# Patient Record
Sex: Female | Born: 1985 | Race: Black or African American | Hispanic: No | Marital: Single | Smoking: Current every day smoker
Health system: Southern US, Community
[De-identification: ages and names within clinical notes are randomized; demographics above are authoritative.]

## PROBLEM LIST (undated history)

## (undated) ENCOUNTER — Ambulatory Visit

## (undated) ENCOUNTER — Encounter

## (undated) ENCOUNTER — Ambulatory Visit
Payer: BLUE CROSS/BLUE SHIELD | Attending: Student in an Organized Health Care Education/Training Program | Primary: Student in an Organized Health Care Education/Training Program

## (undated) ENCOUNTER — Telehealth

## (undated) ENCOUNTER — Encounter: Attending: Internal Medicine | Primary: Internal Medicine

## (undated) ENCOUNTER — Encounter: Attending: Dermatology | Primary: Dermatology

## (undated) ENCOUNTER — Ambulatory Visit: Payer: PRIVATE HEALTH INSURANCE | Attending: Internal Medicine | Primary: Internal Medicine

## (undated) ENCOUNTER — Encounter
Attending: Student in an Organized Health Care Education/Training Program | Primary: Student in an Organized Health Care Education/Training Program

## (undated) ENCOUNTER — Ambulatory Visit: Payer: BLUE CROSS/BLUE SHIELD

## (undated) ENCOUNTER — Encounter: Payer: PRIVATE HEALTH INSURANCE | Attending: Dermatology | Primary: Dermatology

## (undated) ENCOUNTER — Ambulatory Visit
Payer: PRIVATE HEALTH INSURANCE | Attending: Student in an Organized Health Care Education/Training Program | Primary: Student in an Organized Health Care Education/Training Program

## (undated) ENCOUNTER — Telehealth
Attending: Student in an Organized Health Care Education/Training Program | Primary: Student in an Organized Health Care Education/Training Program

## (undated) ENCOUNTER — Ambulatory Visit: Payer: PRIVATE HEALTH INSURANCE | Attending: Dermatology | Primary: Dermatology

## (undated) DIAGNOSIS — Z8619 Personal history of other infectious and parasitic diseases: Secondary | ICD-10-CM

## (undated) DIAGNOSIS — Z8742 Personal history of other diseases of the female genital tract: Secondary | ICD-10-CM

## (undated) DIAGNOSIS — Z87440 Personal history of urinary (tract) infections: Secondary | ICD-10-CM

## (undated) DIAGNOSIS — L732 Hidradenitis suppurativa: Secondary | ICD-10-CM

## (undated) HISTORY — DX: Personal history of other infectious and parasitic diseases: Z86.19

## (undated) HISTORY — PX: PILONIDAL CYST EXCISION: SHX744

## (undated) HISTORY — DX: Hidradenitis suppurativa: L73.2

## (undated) HISTORY — DX: Personal history of other diseases of the female genital tract: Z87.42

## (undated) HISTORY — DX: Personal history of urinary (tract) infections: Z87.440

## (undated) SURGERY — Surgical Case
Anesthesia: *Unknown

---

## 2005-02-01 ENCOUNTER — Ambulatory Visit: Payer: Self-pay | Admitting: Family Medicine

## 2005-03-12 ENCOUNTER — Ambulatory Visit: Payer: Self-pay | Admitting: Family Medicine

## 2005-04-19 ENCOUNTER — Ambulatory Visit: Payer: Self-pay | Admitting: Family Medicine

## 2005-04-19 ENCOUNTER — Other Ambulatory Visit: Admission: RE | Admit: 2005-04-19 | Discharge: 2005-04-19 | Payer: Self-pay | Admitting: Family Medicine

## 2005-04-26 ENCOUNTER — Ambulatory Visit: Payer: Self-pay | Admitting: Family Medicine

## 2005-05-28 ENCOUNTER — Inpatient Hospital Stay (HOSPITAL_COMMUNITY): Admission: AD | Admit: 2005-05-28 | Discharge: 2005-05-28 | Payer: Self-pay | Admitting: *Deleted

## 2005-05-31 ENCOUNTER — Ambulatory Visit: Payer: Self-pay | Admitting: Family Medicine

## 2005-06-07 ENCOUNTER — Ambulatory Visit: Payer: Self-pay | Admitting: Family Medicine

## 2005-07-01 ENCOUNTER — Ambulatory Visit: Payer: Self-pay | Admitting: Family Medicine

## 2005-08-22 ENCOUNTER — Ambulatory Visit: Payer: Self-pay | Admitting: Family Medicine

## 2005-10-07 ENCOUNTER — Inpatient Hospital Stay (HOSPITAL_COMMUNITY): Admission: AD | Admit: 2005-10-07 | Discharge: 2005-10-07 | Payer: Self-pay | Admitting: Family Medicine

## 2005-11-13 ENCOUNTER — Ambulatory Visit: Payer: Self-pay | Admitting: Family Medicine

## 2006-02-04 ENCOUNTER — Ambulatory Visit: Payer: Self-pay | Admitting: Family Medicine

## 2006-12-23 DIAGNOSIS — Z8742 Personal history of other diseases of the female genital tract: Secondary | ICD-10-CM

## 2006-12-23 HISTORY — PX: COLPOSCOPY: SHX161

## 2006-12-23 HISTORY — DX: Personal history of other diseases of the female genital tract: Z87.42

## 2007-02-08 ENCOUNTER — Emergency Department: Payer: Self-pay | Admitting: Emergency Medicine

## 2007-04-02 ENCOUNTER — Emergency Department: Payer: Self-pay | Admitting: General Practice

## 2007-04-14 ENCOUNTER — Emergency Department: Payer: Self-pay | Admitting: Emergency Medicine

## 2007-10-07 ENCOUNTER — Emergency Department: Payer: Self-pay | Admitting: Emergency Medicine

## 2007-10-25 ENCOUNTER — Emergency Department: Payer: Self-pay | Admitting: Emergency Medicine

## 2008-05-05 ENCOUNTER — Observation Stay: Payer: Self-pay

## 2008-05-09 ENCOUNTER — Inpatient Hospital Stay: Payer: Self-pay

## 2008-05-10 DIAGNOSIS — O480 Post-term pregnancy: Secondary | ICD-10-CM

## 2008-09-01 IMAGING — US US OB < 14 WEEKS - US OB TV
1 series · 17 of 28 positions shown · non-contrast
Comparison: none

REASON FOR EXAM: Pregnant, spotting
COMMENTS:

[Series 1: us ob < 14 weeks - us ob tv · 17 of 84 slices shown]
[im 1/84]
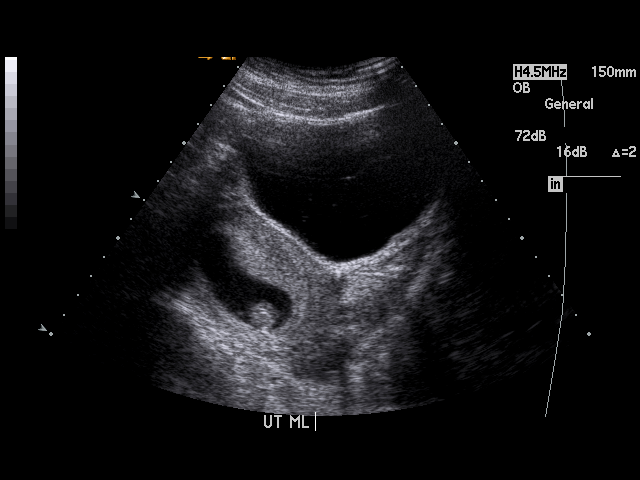
[im 7/84]
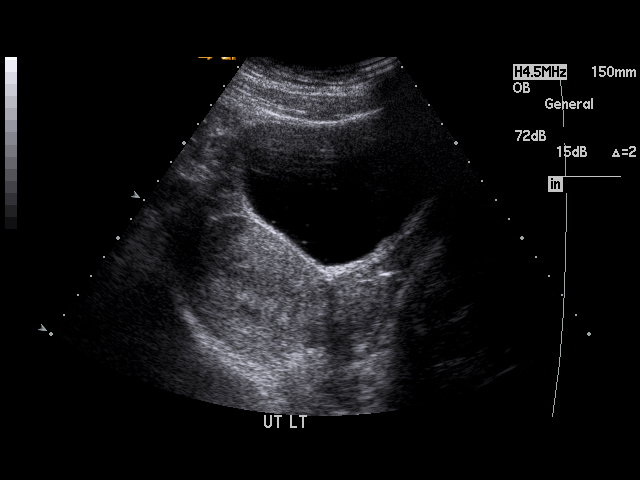
[im 13/84]
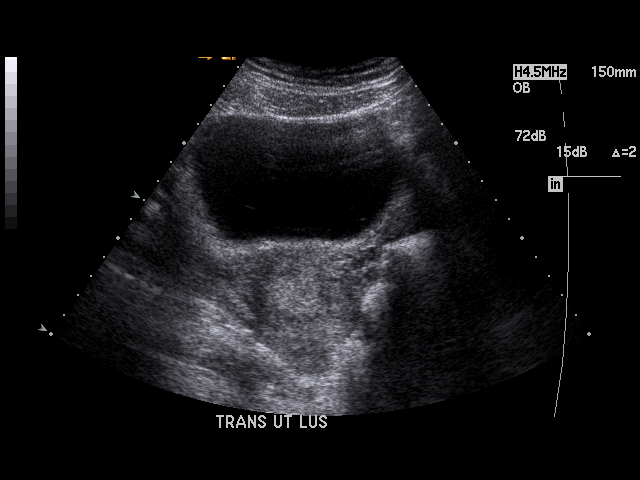
[im 16/84]
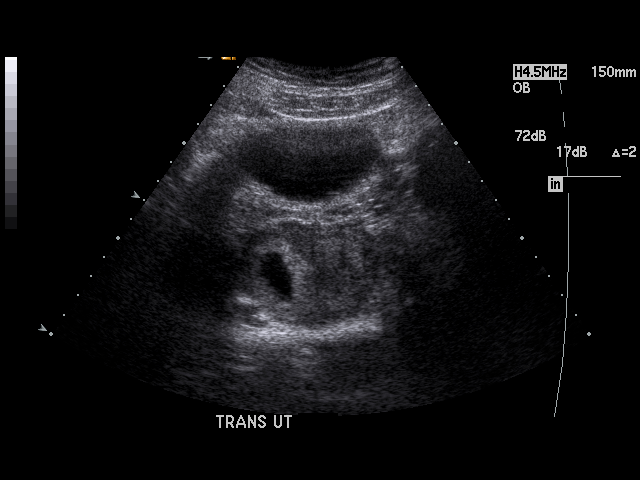
[im 22/84]
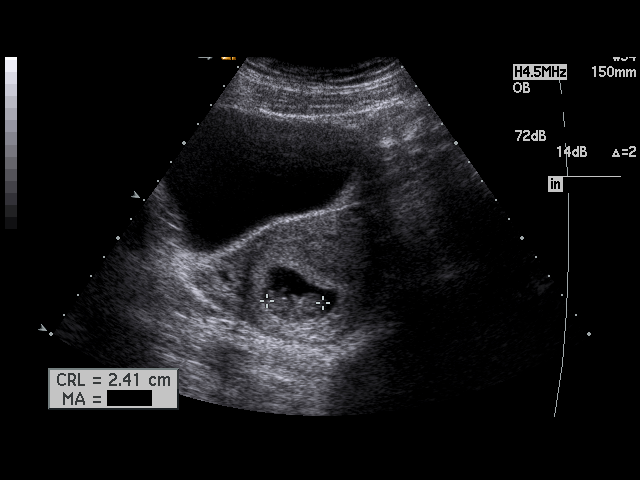
[im 28/84]
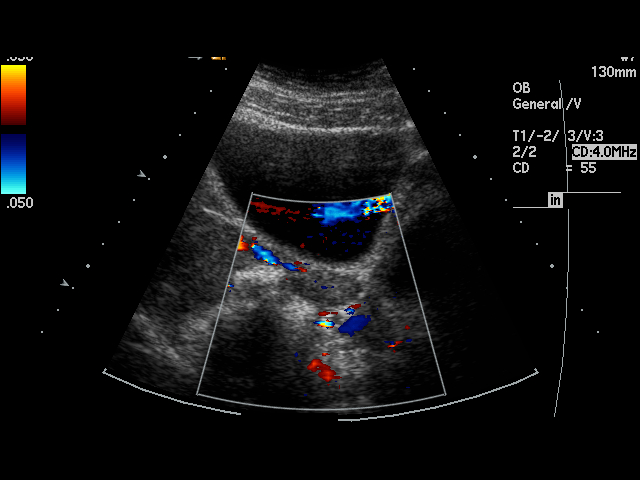
[im 31/84]
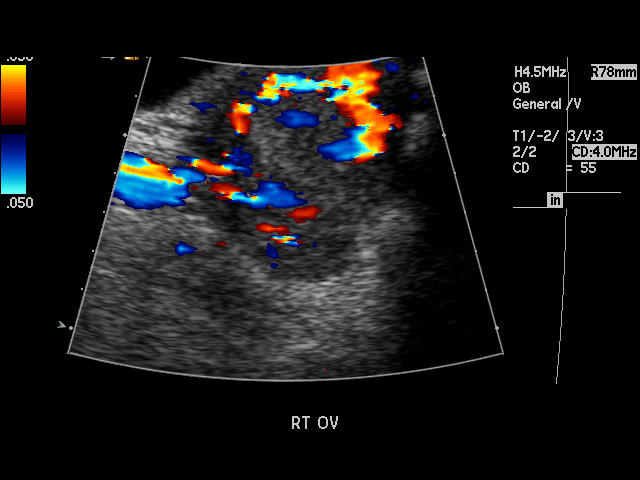
[im 37/84]
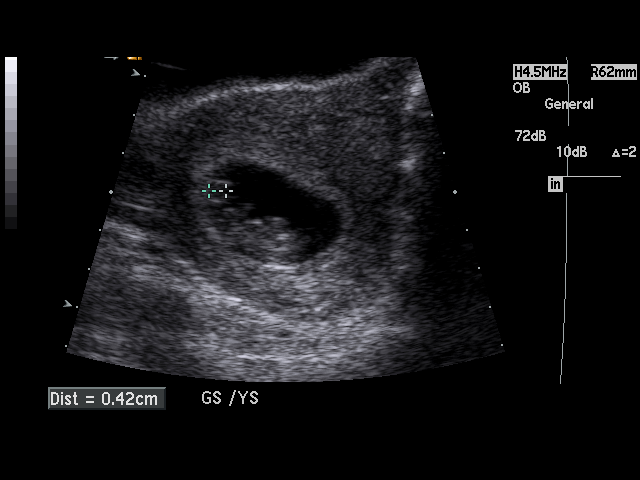
[im 44/84]
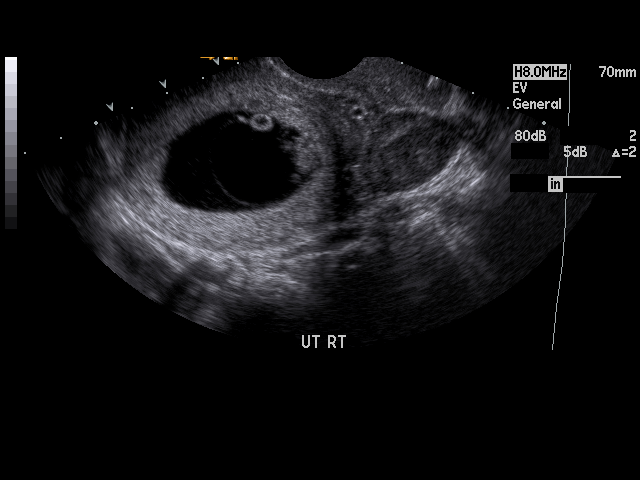
[im 47/84]
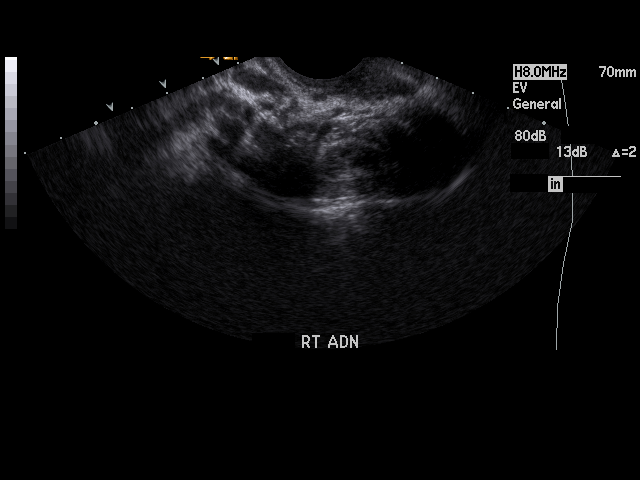
[im 53/84]
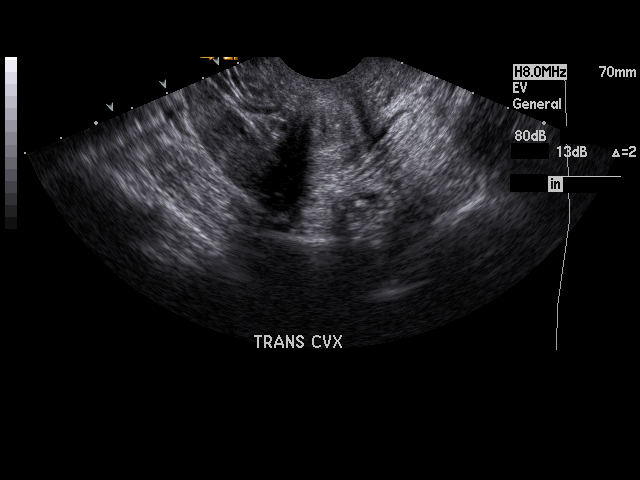
[im 56/84]
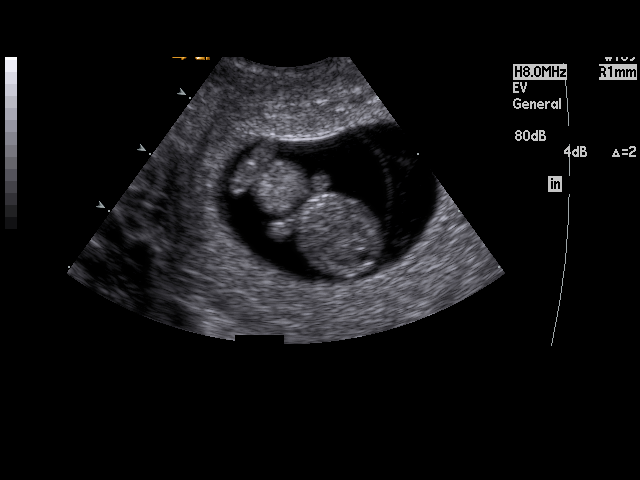
[im 62/84]
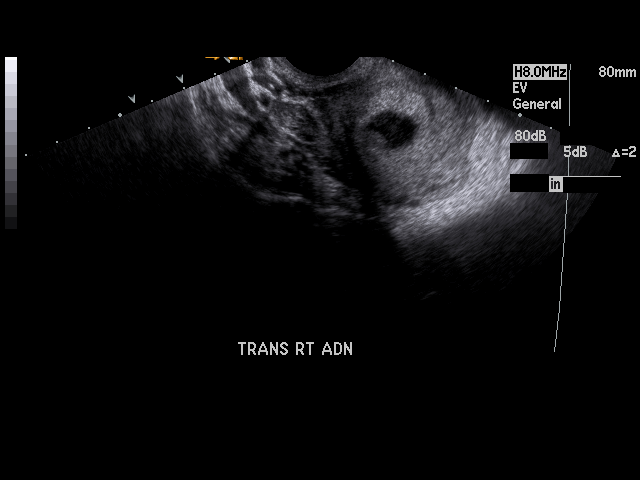
[im 68/84]
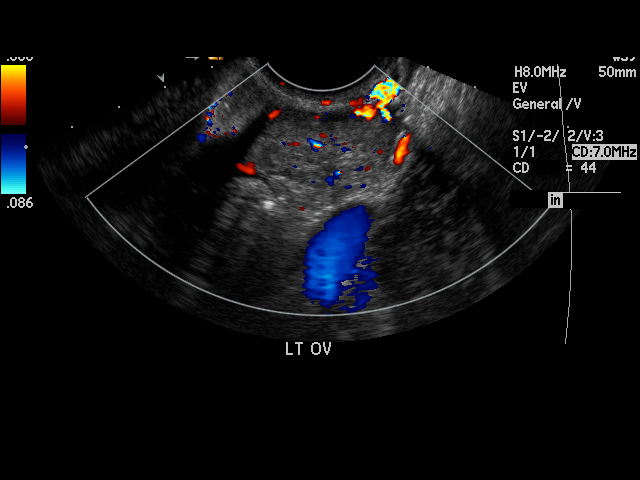
[im 71/84]
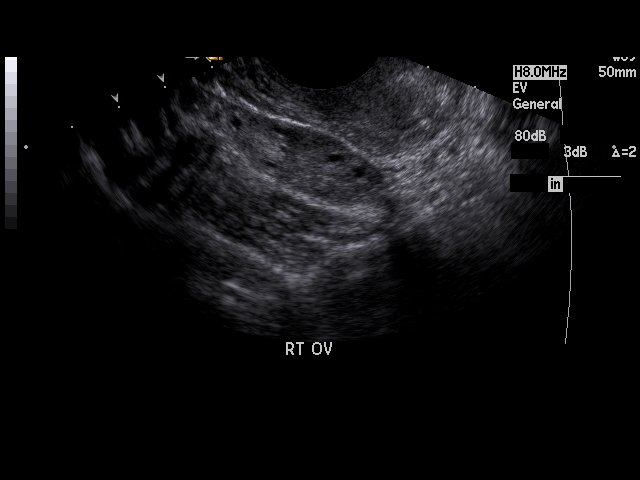
[im 77/84]
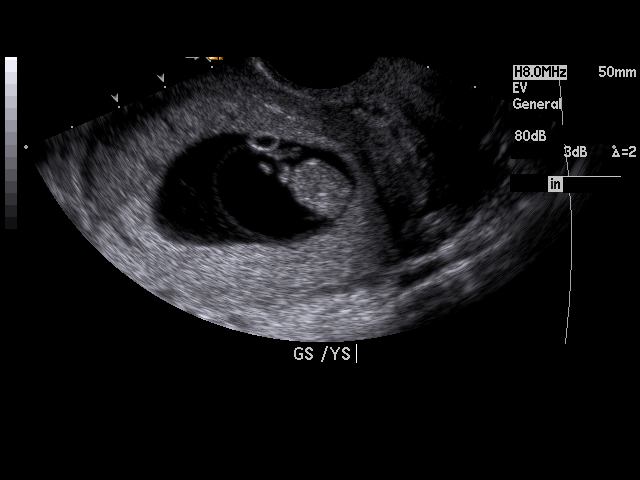
[im 84/84]
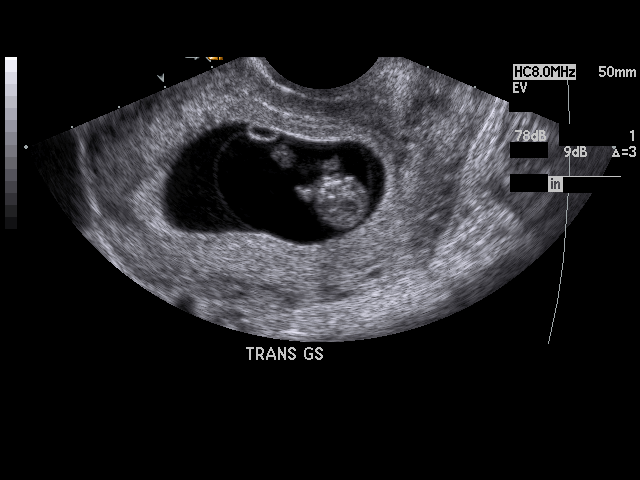

[17 of 28 positions shown; findings below may reference images not displayed]

PROCEDURE:     US  - US OB LESS THAN 14 WEEKS  - October 07, 2007  [DATE]

RESULT:     There is a viable IUP present. The crown-rump length measures an
average of 2.35 cm, corresponding to a 9 weeks, 0 day gestation. Cardiac
activity with a rate of 171 beats per minute was seen. There is no evidence
of a subchorionic hemorrhage. No free fluid is seen in the cul-de-sac. The
RIGHT ovary measures 3.5 x 2.4 x 1.4 cm. The LEFT ovary measures 2.9 x 2.3 x
1.2 cm.
IMPRESSION: There is a viable IUP with an estimated gestational age of
9 weeks, 0 days. The estimated date of confinement is 05/11/2008.

This report was called to the [HOSPITAL] the conclusion of the
study.

## 2009-05-24 ENCOUNTER — Ambulatory Visit: Payer: Self-pay

## 2009-05-24 LAB — HM PAP SMEAR: HM PAP: NEGATIVE

## 2009-11-08 ENCOUNTER — Observation Stay: Payer: Self-pay

## 2009-11-09 ENCOUNTER — Inpatient Hospital Stay: Payer: Self-pay

## 2010-12-23 HISTORY — PX: BREAST SURGERY: SHX581

## 2011-04-26 ENCOUNTER — Emergency Department: Payer: Self-pay | Admitting: Emergency Medicine

## 2011-06-04 ENCOUNTER — Emergency Department: Payer: Self-pay | Admitting: Emergency Medicine

## 2011-08-16 ENCOUNTER — Emergency Department: Payer: Self-pay | Admitting: Emergency Medicine

## 2011-09-02 ENCOUNTER — Emergency Department: Payer: Self-pay | Admitting: Emergency Medicine

## 2011-10-15 ENCOUNTER — Emergency Department: Payer: Self-pay | Admitting: Emergency Medicine

## 2012-02-25 ENCOUNTER — Ambulatory Visit: Payer: Self-pay | Admitting: Emergency Medicine

## 2012-02-25 LAB — RAPID URINE DRUG SCREEN, HOSP PERFORMED
Barbiturates, Ur Screen: NEGATIVE (ref ?–200)
Benzodiazepine, Ur Scrn: NEGATIVE (ref ?–200)
Cannabinoid 50 Ng, Ur ~~LOC~~: POSITIVE (ref ?–50)
Opiate, Ur Screen: NEGATIVE (ref ?–300)

## 2012-02-25 LAB — PREGNANCY, URINE: Pregnancy Test, Urine: NEGATIVE m[IU]/mL

## 2012-02-28 LAB — PATHOLOGY REPORT

## 2013-03-06 ENCOUNTER — Emergency Department: Payer: Self-pay | Admitting: Emergency Medicine

## 2013-07-22 ENCOUNTER — Emergency Department: Payer: Self-pay | Admitting: Emergency Medicine

## 2013-07-22 LAB — URINALYSIS, COMPLETE
Bilirubin,UR: NEGATIVE
Blood: NEGATIVE
Glucose,UR: NEGATIVE mg/dL (ref 0–75)
Nitrite: POSITIVE
Ph: 5 (ref 4.5–8.0)
Protein: NEGATIVE

## 2013-07-22 LAB — GC/CHLAMYDIA PROBE AMP

## 2013-07-22 LAB — WET PREP, GENITAL

## 2015-04-16 NOTE — Op Note (Signed)
PATIENT NAME:  Jamie Dougherty, [name] MR#:  [id number] DATE OF BIRTH:  [birth year]  DATE OF PROCEDURE:  02/25/2012  PREOPERATIVE DIAGNOSIS:  1. Hydradenitis of the left breast.  2. Hydradenitis of the left axilla which looks very complicated. The patient has had multiple openings in the left axilla due to infection.   POSTOPERATIVE DIAGNOSIS:  1. Hydradenitis of the left breast.  2. Hydradenitis of the left axilla which looks very complicated. The patient has had multiple openings in the left axilla due to infection.   PROCEDURE PERFORMED:  Excision of hydradenitis of the left breast and hydradenitis of the left axilla.   SURGEON: Jovita GammaMasud Jalesia Loudenslager, MD    DESCRIPTION OF PROCEDURE: First of all, this patient was seen by me in the office and has a long history of draining from this hydradenitis of the left breast. The left breast was then prepped and draped. An elliptical incision was made at about 6:00 position. After cutting skin and subcutaneous tissue, the infected area was then completely excised. The wound was then closed in layers with 4-0 Vicryl and with skin staples.   The left axilla was then seen. The patient had multiple areas of draining sinuses. The elliptical incision was made, and quite a bit of skin and subcutaneous tissue was then completely excised; and, after that, I put a Penrose drain deep in the axillary space to bring it out. The wound was then closed in layers. The skin was closed with 4-0 nylon. The patient  tolerated the procedure well and was sent to the recovery room in satisfactory condition.  ____________________________ Alton RevereMasud S. Cecelia ByarsHashmi, MD msh:cbb D: 02/25/2012 14:55:57 ET T: 02/25/2012 17:05:23 ET JOB#: [id number]  cc: Khalea Ventura S. Cecelia ByarsHashmi, MD, <Dictator> Meryle ReadyMASUD S Dublin Grayer MD ELECTRONICALLY SIGNED 03/04/2012 13:43

## 2016-08-20 LAB — OB RESULTS CONSOLE RPR: RPR: NONREACTIVE

## 2016-08-20 LAB — OB RESULTS CONSOLE HGB/HCT, BLOOD
HCT: 33 %
Hemoglobin: 10.9 g/dL

## 2016-08-20 LAB — OB RESULTS CONSOLE HEPATITIS B SURFACE ANTIGEN: HEP B S AG: NEGATIVE

## 2016-08-20 LAB — OB RESULTS CONSOLE GC/CHLAMYDIA
Chlamydia: NEGATIVE
GC PROBE AMP, GENITAL: NEGATIVE

## 2016-08-20 LAB — OB RESULTS CONSOLE TSH: TSH: 0.541

## 2016-08-20 LAB — OB RESULTS CONSOLE ABO/RH: RH Type: POSITIVE

## 2016-08-20 LAB — SICKLE CELL SCREEN: SICKLE CELL SCREEN: NORMAL

## 2016-08-20 LAB — OB RESULTS CONSOLE PLATELET COUNT: PLATELETS: 230 10*3/uL

## 2016-08-20 LAB — OB RESULTS CONSOLE RUBELLA ANTIBODY, IGM: RUBELLA: IMMUNE

## 2016-08-20 LAB — OB RESULTS CONSOLE HIV ANTIBODY (ROUTINE TESTING): HIV: NONREACTIVE

## 2016-08-20 LAB — OB RESULTS CONSOLE VARICELLA ZOSTER ANTIBODY, IGG: VARICELLA IGG: IMMUNE

## 2016-08-20 LAB — OB RESULTS CONSOLE ANTIBODY SCREEN: ANTIBODY SCREEN: NEGATIVE

## 2016-12-23 NOTE — L&D Delivery Note (Signed)
Delivery Note At 1:45 AM a viable female was delivered via Vaginal, Spontaneous Delivery (Presentation: OA).  APGAR: 8, 9; weight pending.   Placenta status: delivered spontaneously, intact.  Cord: 3VC with the following complications: none.  Cord pH: n/a  Anesthesia: epidural  Episiotomy: None Lacerations: None Suture Repair: n/a/ Est. Blood Loss (mL): 350  Mom to postpartum.  Baby to Couplet care / Skin to Skin.  Called to see patient.  Mom pushed to deliver a viable female infant.  The head followed by shoulders, which delivered without difficulty, and the rest of the body.  No nuchal cord noted.  Baby to mom's chest.  Cord clamped and cut after > 1 min delay.  No cord blood obtained.  Placenta delivered spontaneously, intact, with a 3-vessel cord.  No vaginal, cervical, or perineal lacerations. All counts correct.  Hemostasis obtained with IV pitocin and fundal massage. EBL 350 mL.     Thomasene Mohair, MD 03/24/2017, 1:58 AM

## 2017-03-06 ENCOUNTER — Ambulatory Visit (INDEPENDENT_AMBULATORY_CARE_PROVIDER_SITE_OTHER): Payer: 59 | Admitting: Advanced Practice Midwife

## 2017-03-06 ENCOUNTER — Encounter: Payer: Self-pay | Admitting: Advanced Practice Midwife

## 2017-03-06 VITALS — BP 108/64 | Ht 65.0 in | Wt 225.0 lb

## 2017-03-06 DIAGNOSIS — Z113 Encounter for screening for infections with a predominantly sexual mode of transmission: Secondary | ICD-10-CM

## 2017-03-06 DIAGNOSIS — Z3480 Encounter for supervision of other normal pregnancy, unspecified trimester: Secondary | ICD-10-CM

## 2017-03-06 DIAGNOSIS — Z3685 Encounter for antenatal screening for Streptococcus B: Secondary | ICD-10-CM

## 2017-03-06 DIAGNOSIS — Z3A36 36 weeks gestation of pregnancy: Secondary | ICD-10-CM

## 2017-03-06 NOTE — Progress Notes (Signed)
CO's of pressure, contractions, swelling, and headaches

## 2017-03-06 NOTE — Patient Instructions (Signed)

## 2017-03-06 NOTE — Progress Notes (Signed)
GBS/aptima today. Cervix is visually closed. Work restriction note given.

## 2017-03-08 LAB — GC/CHLAMYDIA PROBE AMP
CHLAMYDIA, DNA PROBE: NEGATIVE
Neisseria gonorrhoeae by PCR: NEGATIVE

## 2017-03-08 LAB — STREP GP B NAA: Strep Gp B NAA: POSITIVE — AB

## 2017-03-13 ENCOUNTER — Ambulatory Visit (INDEPENDENT_AMBULATORY_CARE_PROVIDER_SITE_OTHER): Payer: 59 | Admitting: Advanced Practice Midwife

## 2017-03-13 VITALS — BP 120/68 | Wt 224.0 lb

## 2017-03-13 DIAGNOSIS — Z3A37 37 weeks gestation of pregnancy: Secondary | ICD-10-CM

## 2017-03-13 NOTE — Patient Instructions (Signed)

## 2017-03-13 NOTE — Progress Notes (Signed)
ctx

## 2017-03-13 NOTE — Progress Notes (Signed)
Feeling tired. Reviewed labor precautions. Work note for last day 4/4 given to pt today.

## 2017-03-19 ENCOUNTER — Ambulatory Visit (INDEPENDENT_AMBULATORY_CARE_PROVIDER_SITE_OTHER): Payer: 59 | Admitting: Advanced Practice Midwife

## 2017-03-19 VITALS — BP 118/74 | Wt 226.0 lb

## 2017-03-19 DIAGNOSIS — Z3A38 38 weeks gestation of pregnancy: Secondary | ICD-10-CM

## 2017-03-19 NOTE — Progress Notes (Signed)
Pt has decided to go out of work beginning today. Work note given. Reviewed labor precautions. Having a few painful contractions, mostly BH ctx.

## 2017-03-23 ENCOUNTER — Inpatient Hospital Stay
Admission: EM | Admit: 2017-03-23 | Discharge: 2017-03-26 | DRG: 775 | Disposition: A | Discharge status: Home / Self Care | Payer: Commercial Managed Care - HMO | Attending: Obstetrics and Gynecology | Admitting: Obstetrics and Gynecology

## 2017-03-23 ENCOUNTER — Inpatient Hospital Stay: Payer: Commercial Managed Care - HMO | Admitting: Anesthesiology

## 2017-03-23 DIAGNOSIS — D649 Anemia, unspecified: Secondary | ICD-10-CM | POA: Diagnosis not present

## 2017-03-23 DIAGNOSIS — O99334 Smoking (tobacco) complicating childbirth: Secondary | ICD-10-CM | POA: Diagnosis present

## 2017-03-23 DIAGNOSIS — O99214 Obesity complicating childbirth: Secondary | ICD-10-CM | POA: Diagnosis present

## 2017-03-23 DIAGNOSIS — Z3A39 39 weeks gestation of pregnancy: Secondary | ICD-10-CM | POA: Diagnosis not present

## 2017-03-23 DIAGNOSIS — O480 Post-term pregnancy: Secondary | ICD-10-CM | POA: Diagnosis present

## 2017-03-23 DIAGNOSIS — F17210 Nicotine dependence, cigarettes, uncomplicated: Secondary | ICD-10-CM | POA: Diagnosis present

## 2017-03-23 DIAGNOSIS — O99824 Streptococcus B carrier state complicating childbirth: Secondary | ICD-10-CM | POA: Diagnosis present

## 2017-03-23 DIAGNOSIS — O99213 Obesity complicating pregnancy, third trimester: Secondary | ICD-10-CM | POA: Diagnosis present

## 2017-03-23 DIAGNOSIS — O9081 Anemia of the puerperium: Secondary | ICD-10-CM | POA: Diagnosis not present

## 2017-03-23 DIAGNOSIS — O0993 Supervision of high risk pregnancy, unspecified, third trimester: Secondary | ICD-10-CM

## 2017-03-23 DIAGNOSIS — O4292 Full-term premature rupture of membranes, unspecified as to length of time between rupture and onset of labor: Secondary | ICD-10-CM | POA: Diagnosis present

## 2017-03-23 DIAGNOSIS — Z6833 Body mass index (BMI) 33.0-33.9, adult: Secondary | ICD-10-CM

## 2017-03-23 DIAGNOSIS — Z8249 Family history of ischemic heart disease and other diseases of the circulatory system: Secondary | ICD-10-CM

## 2017-03-23 DIAGNOSIS — E669 Obesity, unspecified: Secondary | ICD-10-CM | POA: Diagnosis present

## 2017-03-23 LAB — CBC
HEMATOCRIT: 33.9 % — AB (ref 35.0–47.0)
Hemoglobin: 11.3 g/dL — ABNORMAL LOW (ref 12.0–16.0)
MCH: 26.7 pg (ref 26.0–34.0)
MCHC: 33.4 g/dL (ref 32.0–36.0)
MCV: 79.9 fL — ABNORMAL LOW (ref 80.0–100.0)
PLATELETS: 220 10*3/uL (ref 150–440)
RBC: 4.25 MIL/uL (ref 3.80–5.20)
RDW: 17 % — AB (ref 11.5–14.5)
WBC: 7.1 10*3/uL (ref 3.6–11.0)

## 2017-03-23 LAB — TYPE AND SCREEN
ABO/RH(D): O POS
ANTIBODY SCREEN: NEGATIVE

## 2017-03-23 LAB — URINE DRUG SCREEN, QUALITATIVE (ARMC ONLY)
AMPHETAMINES, UR SCREEN: NOT DETECTED
BARBITURATES, UR SCREEN: NOT DETECTED
BENZODIAZEPINE, UR SCRN: NOT DETECTED
CANNABINOID 50 NG, UR ~~LOC~~: NOT DETECTED
Cocaine Metabolite,Ur ~~LOC~~: NOT DETECTED
MDMA (Ecstasy)Ur Screen: NOT DETECTED
Methadone Scn, Ur: NOT DETECTED
Opiate, Ur Screen: NOT DETECTED
PHENCYCLIDINE (PCP) UR S: NOT DETECTED
Tricyclic, Ur Screen: NOT DETECTED

## 2017-03-23 LAB — GLUCOSE, CAPILLARY: GLUCOSE-CAPILLARY: 127 mg/dL — AB (ref 65–99)

## 2017-03-23 MED ORDER — PHENYLEPHRINE 40 MCG/ML (10ML) SYRINGE FOR IV PUSH (FOR BLOOD PRESSURE SUPPORT)
80.0000 ug | PREFILLED_SYRINGE | INTRAVENOUS | Status: DC | PRN
  Filled 2017-03-23: qty 5

## 2017-03-23 MED ORDER — PENICILLIN G POTASSIUM 5000000 UNITS IJ SOLR
5.0000 10*6.[IU] | Freq: Once | INTRAVENOUS | Status: DC
  Filled 2017-03-23: qty 5

## 2017-03-23 MED ORDER — MISOPROSTOL 200 MCG PO TABS
ORAL_TABLET | ORAL | Status: AC
  Filled 2017-03-23: qty 4

## 2017-03-23 MED ORDER — LIDOCAINE HCL (PF) 1 % IJ SOLN: 30.0000 mL | INTRAMUSCULAR | Status: DC | PRN

## 2017-03-23 MED ORDER — SODIUM CHLORIDE FLUSH 0.9 % IV SOLN
INTRAVENOUS | Status: AC
  Filled 2017-03-23: qty 10

## 2017-03-23 MED ORDER — SODIUM CHLORIDE 0.9 % IV SOLN
2.0000 g | Freq: Once | INTRAVENOUS | Status: AC
  Administered 2017-03-23: 2 g via INTRAVENOUS
  Filled 2017-03-23: qty 2000

## 2017-03-23 MED ORDER — SODIUM CHLORIDE 0.9 % IV SOLN
1.0000 g | INTRAVENOUS | Status: DC
  Administered 2017-03-23 – 2017-03-24 (×3): 1 g via INTRAVENOUS
  Filled 2017-03-23 (×5): qty 1000

## 2017-03-23 MED ORDER — SOD CITRATE-CITRIC ACID 500-334 MG/5ML PO SOLN
30.0000 mL | ORAL | Status: DC | PRN
  Administered 2017-03-23: 30 mL via ORAL
  Filled 2017-03-23: qty 15
  Filled 2017-03-23: qty 30

## 2017-03-23 MED ORDER — PENICILLIN G POT IN DEXTROSE 60000 UNIT/ML IV SOLN
3.0000 10*6.[IU] | INTRAVENOUS | Status: DC
  Filled 2017-03-23 (×3): qty 50

## 2017-03-23 MED ORDER — EPHEDRINE 5 MG/ML INJ
10.0000 mg | INTRAVENOUS | Status: DC | PRN
  Filled 2017-03-23: qty 2

## 2017-03-23 MED ORDER — FENTANYL 2.5 MCG/ML W/ROPIVACAINE 0.2% IN NS 100 ML EPIDURAL INFUSION (ARMC-ANES)
EPIDURAL | Status: DC | PRN
  Administered 2017-03-23: 10 mL/h via EPIDURAL

## 2017-03-23 MED ORDER — LIDOCAINE-EPINEPHRINE (PF) 1.5 %-1:200000 IJ SOLN
INTRAMUSCULAR | Status: DC | PRN
  Administered 2017-03-23: 4 mL via EPIDURAL

## 2017-03-23 MED ORDER — LACTATED RINGERS IV SOLN
INTRAVENOUS | Status: DC
  Administered 2017-03-23: 13:00:00 via INTRAVENOUS

## 2017-03-23 MED ORDER — LIDOCAINE HCL (PF) 1 % IJ SOLN
INTRAMUSCULAR | Status: DC | PRN
  Administered 2017-03-23: 3 mL

## 2017-03-23 MED ORDER — ONDANSETRON HCL 4 MG/2ML IJ SOLN: 4.0000 mg | Freq: Four times a day (QID) | INTRAMUSCULAR | Status: DC | PRN

## 2017-03-23 MED ORDER — BUPIVACAINE HCL (PF) 0.25 % IJ SOLN
INTRAMUSCULAR | Status: DC | PRN
Start: 2017-03-23 — End: 2017-03-24
  Administered 2017-03-23 (×2): 4 mL via EPIDURAL

## 2017-03-23 MED ORDER — FENTANYL 2.5 MCG/ML W/ROPIVACAINE 0.2% IN NS 100 ML EPIDURAL INFUSION (ARMC-ANES): 10.0000 mL/h | EPIDURAL | Status: DC

## 2017-03-23 MED ORDER — LACTATED RINGERS IV SOLN: 500.0000 mL | Freq: Once | INTRAVENOUS | Status: DC

## 2017-03-23 MED ORDER — LIDOCAINE HCL (PF) 1 % IJ SOLN
INTRAMUSCULAR | Status: AC
  Filled 2017-03-23: qty 30

## 2017-03-23 MED ORDER — FENTANYL 2.5 MCG/ML W/ROPIVACAINE 0.2% IN NS 100 ML EPIDURAL INFUSION (ARMC-ANES)
EPIDURAL | Status: AC
  Filled 2017-03-23: qty 100

## 2017-03-23 MED ORDER — AMMONIA AROMATIC IN INHA
RESPIRATORY_TRACT | Status: AC
  Filled 2017-03-23: qty 10

## 2017-03-23 MED ORDER — DIPHENHYDRAMINE HCL 50 MG/ML IJ SOLN: 12.5000 mg | INTRAMUSCULAR | Status: DC | PRN

## 2017-03-23 MED ORDER — HYDROMORPHONE HCL 1 MG/ML IJ SOLN
0.5000 mg | INTRAMUSCULAR | Status: AC
  Administered 2017-03-23: 0.5 mg via INTRAVENOUS
  Filled 2017-03-23: qty 0.5

## 2017-03-23 MED ORDER — OXYTOCIN 40 UNITS IN LACTATED RINGERS INFUSION - SIMPLE MED: 2.5000 [IU]/h | INTRAVENOUS | Status: DC

## 2017-03-23 MED ORDER — LACTATED RINGERS IV SOLN
500.0000 mL | INTRAVENOUS | Status: DC | PRN
  Administered 2017-03-23: 500 mL via INTRAVENOUS

## 2017-03-23 MED ORDER — OXYTOCIN 40 UNITS IN LACTATED RINGERS INFUSION - SIMPLE MED
1.0000 m[IU]/min | INTRAVENOUS | Status: DC
  Administered 2017-03-23: 1 m[IU]/min via INTRAVENOUS

## 2017-03-23 MED ORDER — OXYTOCIN 40 UNITS IN LACTATED RINGERS INFUSION - SIMPLE MED
INTRAVENOUS | Status: AC
  Filled 2017-03-23: qty 1000

## 2017-03-23 MED ORDER — OXYTOCIN BOLUS FROM INFUSION: 500.0000 mL | Freq: Once | INTRAVENOUS | Status: DC

## 2017-03-23 MED ORDER — OXYTOCIN 10 UNIT/ML IJ SOLN: 10.0000 [IU] | Freq: Once | INTRAMUSCULAR | Status: DC

## 2017-03-23 MED ORDER — OXYTOCIN 10 UNIT/ML IJ SOLN
INTRAMUSCULAR | Status: AC
Start: 2017-03-23 — End: 2017-03-24
  Filled 2017-03-23: qty 2

## 2017-03-23 NOTE — H&P (Signed)
OB History & Physical   History of Present Illness:  Chief Complaint: water broke  HPI:  BECKI MCCASKILL is a 31 y.o. G50P2002 female at [redacted]w[redacted]d dated by early first trimester ultrasound that changed her EDD due to unsure LMP.  Her pregnancy has been complicated by obesity with BMI in low 30s, did not get 28 week labs.    She reports mild contractions.   She reports leakage of fluid today that was [place] and continues to leak.   She denies vaginal bleeding.   She reports fetal movement.    Maternal Medical History:   Past Medical History:  Diagnosis Date  . Hidradenitis suppurativa   . History of abnormal cervical Pap smear 2008  . History of chlamydia   . History of gonorrhea   . Personal history of urinary (tract) infections     Past Surgical History:  Procedure Laterality Date  . BREAST SURGERY Left 2012   underarm  . BREAST SURGERY Right 2012  . COLPOSCOPY  2008  . PILONIDAL CYST EXCISION  2004; 2009    Allergies  Allergen Reactions  . Sulfa Antibiotics Nausea And Vomiting and Other (See Comments)    Headache     Prior to Admission medications   Medication Sig Start Date End Date Taking? Authorizing Provider  Prenatal Vit-Fe Fumarate-FA (PRENATAL MULTIVITAMIN) TABS tablet Take 1 tablet by mouth daily at 12 noon.   Yes Historical Provider, MD  ranitidine (ZANTAC) 75 MG tablet Take 75 mg by mouth 2 (two) times daily.   Yes Historical Provider, MD    OB History  Gravida Para Term Preterm AB Living  SAB TAB Ectopic Multiple Live Births          2    # Outcome Date GA Lbr Len/2nd Weight Sex Delivery Anes PTL Lv  3 Current           2 Term 11/09/09 [redacted]w[redacted]d  7 lb 14 oz (3.572 kg) M Vag-Spont   LIV  1 Term 05/10/08 [redacted]w[redacted]d  6 lb 10 oz (3.005 kg) M Vag-Spont  Y LIV     Complications: Post-dates pregnancy      Prenatal care site: [facility]  Social History: She  reports that she has been smoking.  She has been smoking about 0.25 packs per day. She has  never used smokeless tobacco. She reports that she does not drink alcohol or use drugs.  Family History: family history includes Colon cancer (age of onset: 4) in her paternal grandfather; Hypertension in her maternal grandmother and mother.   Review of Systems:  Review of Systems  Constitutional: Negative.   HENT: Negative.   Eyes: Negative.   Respiratory: Negative.   Cardiovascular: Negative.   Gastrointestinal: Negative.   Genitourinary:       Per HPI  Musculoskeletal: Negative.   Skin: Negative.   Neurological: Negative.   Psychiatric/Behavioral: Negative.      Physical Exam:  Vital Signs: LMP 06/27/2016  Physical Exam  Constitutional: She is oriented to person, place, and time and well-developed, well-nourished, and in no distress. No distress.  obese  HENT:  Head: Normocephalic and atraumatic.  Eyes: Conjunctivae are normal. No scleral icterus.  Neck: Normal range of motion. Neck supple. No thyromegaly present.  Cardiovascular: Normal rate and regular rhythm.   Pulmonary/Chest: Effort normal and breath sounds normal. She has no wheezes. She has no rales.  Abdominal:  Gravid, nt  Genitourinary:  Genitourinary Comments:  Per RN, cvx is 1cm,  Gross rupture of membranes with thin meconium staining  Musculoskeletal: Normal range of motion. She exhibits no edema.  Lymphadenopathy:    She has no cervical adenopathy.  Neurological: She is alert and oriented to person, place, and time. No cranial nerve deficit.  Skin: Skin is warm and dry. No rash noted.  Psychiatric: Mood, affect and judgment normal.    Pertinent Results:  Prenatal Labs: Blood type/Rh O positive  Antibody screen negative  Rubella Immune  Varicella Immune    RPR NR  HBsAg negative  HIV negative  GC negative  Chlamydia negative  Genetic screening Did not get  1 hour GTT Patient never had done  3 hour GTT n/a  GBS positive   Baseline FHR: 125 beats/min   Variability: moderate   Accelerations:  present   Decelerations: absent Contractions: present frequency: rare Overall assessment: category 1  Assessment:  HAJER DWYER is a 31 y.o. G34P2002 female at [redacted]w[redacted]d with PROM.   Plan:  1. Admit to Labor & Delivery  2. CBC, T&S, Clrs, IVF 3. GBS positive.  Pharmacy out of PCN, so Ampicillin per CDC guidelines.  4. Fetwal well-being: reassuring, meconium fluid. Peds present at delivery and make them aware. 5. Will give ampicillin x 2 doses prior to any augmentation. 6. POCT CBG: 127 mg/dL.  Continue to monitor.    Thomasene Mohair, MD 03/23/2017 12:56 PM

## 2017-03-23 NOTE — OB Triage Note (Signed)
Patient reports to triage for "leaking of fluid" large gush around 1030 running down her legs, has been having contractions for a few days but no regular pattern. Nitrazine is questionable.

## 2017-03-23 NOTE — Anesthesia Preprocedure Evaluation (Signed)
Anesthesia Evaluation  Patient identified by MRN, date of birth, ID band Patient awake    Reviewed: Allergy & Precautions, H&P , NPO status , Patient's Chart, lab work & pertinent test results, reviewed documented beta blocker date and time   Airway Mallampati: II  TM Distance: >3 FB Neck ROM: full    Dental no notable dental hx. (+) Teeth Intact   Pulmonary neg pulmonary ROS, Current Smoker,    Pulmonary exam normal breath sounds clear to auscultation       Cardiovascular Exercise Tolerance: Good negative cardio ROS   Rhythm:regular Rate:Normal     Neuro/Psych negative neurological ROS  negative psych ROS   GI/Hepatic negative GI ROS, Neg liver ROS,   Endo/Other  negative endocrine ROSdiabetes  Renal/GU      Musculoskeletal   Abdominal   Peds  Hematology negative hematology ROS (+)   Anesthesia Other Findings   Reproductive/Obstetrics (+) Pregnancy                             Anesthesia Physical Anesthesia Plan  ASA: II  Anesthesia Plan: Epidural   Post-op Pain Management:    Induction:   Airway Management Planned:   Additional Equipment:   Intra-op Plan:   Post-operative Plan:   Informed Consent: I have reviewed the patients History and Physical, chart, labs and discussed the procedure including the risks, benefits and alternatives for the proposed anesthesia with the patient or authorized representative who has indicated his/her understanding and acceptance.     Plan Discussed with:   Anesthesia Plan Comments:         Anesthesia Quick Evaluation  

## 2017-03-23 NOTE — Anesthesia Procedure Notes (Signed)
Epidural Patient location during procedure: OB  Staffing Performed: anesthesiologist   Preanesthetic Checklist Completed: patient identified, site marked, surgical consent, pre-op evaluation, timeout performed, IV checked, risks and benefits discussed and monitors and equipment checked  Epidural Patient position: sitting Prep: Betadine Patient monitoring: heart rate, continuous pulse ox and blood pressure Approach: midline Location: L4-L5 Injection technique: LOR saline  Needle:  Needle type: Tuohy  Needle gauge: 17 G Needle length: 9 cm and 9 Needle insertion depth: 5 cm Catheter type: closed end flexible Catheter size: 19 Gauge Catheter at skin depth: 13 cm Test dose: negative and 1.5% lidocaine with Epi 1:200 K  Assessment Sensory level: T10 Events: blood not aspirated, injection not painful, no injection resistance, negative IV test and no paresthesia  Additional Notes   Patient tolerated the insertion well without complications.-SATD -IVTD. No paresthesia. Refer to [facility] nursing for VS and dosingReason for block:procedure for pain

## 2017-03-24 ENCOUNTER — Encounter: Payer: Self-pay | Admitting: *Deleted

## 2017-03-24 DIAGNOSIS — Z3A39 39 weeks gestation of pregnancy: Secondary | ICD-10-CM

## 2017-03-24 DIAGNOSIS — O480 Post-term pregnancy: Secondary | ICD-10-CM

## 2017-03-24 LAB — CBC
HEMATOCRIT: 32.1 % — AB (ref 35.0–47.0)
Hemoglobin: 10.5 g/dL — ABNORMAL LOW (ref 12.0–16.0)
MCH: 26.6 pg (ref 26.0–34.0)
MCHC: 32.7 g/dL (ref 32.0–36.0)
MCV: 81.6 fL (ref 80.0–100.0)
PLATELETS: 175 10*3/uL (ref 150–440)
RBC: 3.94 MIL/uL (ref 3.80–5.20)
RDW: 16.7 % — AB (ref 11.5–14.5)
WBC: 9.2 10*3/uL (ref 3.6–11.0)

## 2017-03-24 LAB — RPR: RPR: NONREACTIVE

## 2017-03-24 MED ORDER — ACETAMINOPHEN 325 MG PO TABS
650.0000 mg | ORAL_TABLET | ORAL | Status: DC | PRN
  Administered 2017-03-24 – 2017-03-26 (×4): 650 mg via ORAL
  Filled 2017-03-24 (×4): qty 2

## 2017-03-24 MED ORDER — ONDANSETRON HCL 4 MG/2ML IJ SOLN: 4.0000 mg | INTRAMUSCULAR | Status: DC | PRN

## 2017-03-24 MED ORDER — DIPHENHYDRAMINE HCL 25 MG PO CAPS: 25.0000 mg | ORAL_CAPSULE | Freq: Four times a day (QID) | ORAL | Status: DC | PRN

## 2017-03-24 MED ORDER — FERROUS SULFATE 325 (65 FE) MG PO TABS
325.0000 mg | ORAL_TABLET | Freq: Two times a day (BID) | ORAL | Status: DC
  Administered 2017-03-24 – 2017-03-26 (×5): 325 mg via ORAL
  Filled 2017-03-24 (×5): qty 1

## 2017-03-24 MED ORDER — COCONUT OIL OIL
1.0000 "application " | TOPICAL_OIL | Status: DC | PRN
  Administered 2017-03-24: 1 via TOPICAL
  Filled 2017-03-24: qty 120

## 2017-03-24 MED ORDER — HYDROCODONE-ACETAMINOPHEN 5-325 MG PO TABS
1.0000 | ORAL_TABLET | Freq: Four times a day (QID) | ORAL | Status: DC | PRN
  Administered 2017-03-24 – 2017-03-26 (×7): 1 via ORAL
  Filled 2017-03-24 (×7): qty 1

## 2017-03-24 MED ORDER — DIBUCAINE 1 % RE OINT: 1.0000 "application " | TOPICAL_OINTMENT | RECTAL | Status: DC | PRN

## 2017-03-24 MED ORDER — SIMETHICONE 80 MG PO CHEW: 80.0000 mg | CHEWABLE_TABLET | ORAL | Status: DC | PRN

## 2017-03-24 MED ORDER — SENNOSIDES-DOCUSATE SODIUM 8.6-50 MG PO TABS
2.0000 | ORAL_TABLET | ORAL | Status: DC
  Administered 2017-03-24 – 2017-03-25 (×2): 2 via ORAL
  Filled 2017-03-24 (×2): qty 2

## 2017-03-24 MED ORDER — PRENATAL MULTIVITAMIN CH
1.0000 | ORAL_TABLET | Freq: Every day | ORAL | Status: DC
  Administered 2017-03-24 – 2017-03-25 (×2): 1 via ORAL
  Filled 2017-03-24 (×2): qty 1

## 2017-03-24 MED ORDER — BENZOCAINE-MENTHOL 20-0.5 % EX AERO: 1.0000 "application " | INHALATION_SPRAY | CUTANEOUS | Status: DC | PRN

## 2017-03-24 MED ORDER — WITCH HAZEL-GLYCERIN EX PADS: 1.0000 "application " | MEDICATED_PAD | CUTANEOUS | Status: DC | PRN

## 2017-03-24 MED ORDER — IBUPROFEN 600 MG PO TABS
600.0000 mg | ORAL_TABLET | Freq: Four times a day (QID) | ORAL | Status: DC
Start: 2017-03-24 — End: 2017-03-26
  Administered 2017-03-24 – 2017-03-26 (×9): 600 mg via ORAL
  Filled 2017-03-24 (×9): qty 1

## 2017-03-24 MED ORDER — ONDANSETRON HCL 4 MG PO TABS: 4.0000 mg | ORAL_TABLET | ORAL | Status: DC | PRN

## 2017-03-24 NOTE — Progress Notes (Signed)
States pain improved. Not seeing spots. Eating a snack.

## 2017-03-24 NOTE — Progress Notes (Signed)
Post Partum Day 0 Subjective: no complaints, voiding, tolerating PO and sleepy. Breast and bottle feeding  Objective: Blood pressure 129/65, pulse 65, temperature 98 F (36.7 C), temperature source Oral, resp. rate 20, height  (1.651 m), weight 102.5 kg (226 lb), last menstrual period 06/27/2016, SpO2 99 %, unknown if currently breastfeeding.  Physical Exam:  General: cooperative, fatigued and no distress Lochia: appropriate Uterine Fundus: firm/ at U/ ML/ NT DVT Evaluation: No evidence of DVT seen on physical exam.   Recent Labs  03/23/17 1214 03/24/17 0600  HGB 11.3* 10.5*  HCT 33.9* 32.1*  WBC 7.1 9.2  PLT 220 175    Assessment/Plan: Stable day of delivery  Continue postpartum care Mild anemia postpartum  Prenatal vitamins with iron Plan discharge tomorrow O POS/ RI/ VI/ GBS positive Breast and bottle    LOS: 1 day   Farrel Conners 03/24/2017, 10:23 AM

## 2017-03-24 NOTE — Progress Notes (Signed)
C/O abd and left thigh pain. States seeing spots. No blurred vision. BP 138/82. Pulse 65, O2 sat 100.

## 2017-03-24 NOTE — Discharge Summary (Signed)
OB Discharge Summary     Patient Name: Jamie Dougherty DOB: 02-03-1986 MRN: [id number]  Date of admission: 03/23/2017 Delivering MD: Thomasene Mohair, MD  Date of Delivery: 03/24/2017  Date of discharge: 03/26/2017  Admitting diagnosis:  1) intrauterine pregnancy at [redacted]w[redacted]d 2) rupture of membranes with meconium present 3) supervision high risk pregnancy, third trimester 4) obesity complicating pregnancy, third trimester 5) bmi 33  Intrauterine pregnancy: [redacted]w[redacted]d     Secondary diagnosis: None     Discharge diagnosis: Term Pregnancy Delivered                                                                                                Post partum procedures:none  Augmentation: Pitocin  Complications: None  Hospital course:  Onset of Labor With Vaginal Delivery     31 y.o. yo G3P2002 at [redacted]w[redacted]d was admitted in Latent Labor on 03/23/2017. Patient had an uncomplicated labor course as follows:  Membrane Rupture Time/Date: 10:30 AM ,03/23/2017   Intrapartum Procedures: Episiotomy: None [1]                                         Lacerations:  None [1]  Patient had a delivery of a Viable infant. 03/24/2017  Information for the patient's newborn:  Colene, [place] [[id number]]  Delivery Method: Vag-Spont    Pateint had an uncomplicated postpartum course.  She is ambulating, tolerating a regular diet, passing flatus, and urinating well. Patient is discharged home in stable condition on 03/26/17.   Physical exam  Vitals:   03/25/17 0732 03/25/17 1135 03/25/17 1554 03/25/17 1905  BP: (!) 116/59   (!) 114/52  Pulse: 72   69  Resp: 17   18  Temp: 98 F (36.7 C) 98.1 F (36.7 C) 98.4 F (36.9 C) 98.1 F (36.7 C)  TempSrc: Oral Oral Oral Oral  SpO2: 99%   100%  Weight:      Height:       General: alert and no distress Lochia: appropriate Uterine Fundus: firm Incision: Healing well with no significant drainage DVT Evaluation: No evidence of DVT seen on physical exam.  Labs: Lab Results   Component Value Date   WBC 9.2 03/24/2017   HGB 10.5 (L) 03/24/2017   HCT 32.1 (L) 03/24/2017   MCV 81.6 03/24/2017   PLT 175 03/24/2017    Discharge instruction: per After Visit Summary.  Medications:  Allergies as of 03/26/2017      Reactions   Sulfa Antibiotics Nausea And Vomiting, Other (See Comments)   Headache       Medication List    TAKE these medications   ibuprofen 600 MG tablet Commonly known as:  ADVIL,MOTRIN Take 1 tablet (600 mg total) by mouth every 6 (six) hours.   prenatal multivitamin Tabs tablet Take 1 tablet by mouth daily at 12 noon.   ranitidine 75 MG tablet Commonly known as:  ZANTAC Take 75 mg by mouth 2 (two) times daily.       Diet: routine diet  Activity: Advance as tolerated. Pelvic rest for 6 weeks.   Outpatient follow up: Follow-up Information    Thomasene Mohair, MD Follow up in 6 week(s).   Specialty:  Obstetrics and Gynecology Why:  postpartum follow up Contact information: [address] [place] [address] [phone]             Postpartum contraception: nexplanon Rhogam Given postpartum: no Rubella vaccine given postpartum: Immune Varicella vaccine given postpartum:Immune TDaP given antepartum or postpartum:  Newborn Data: Live born female  Birth Weight:   APGAR: 8, 9   Baby Feeding: Bottle  Disposition:home with mother  SIGNED: Vena Austria

## 2017-03-25 NOTE — Lactation Note (Signed)
This note was copied from a baby's chart. Lactation Consultation Note  Patient Name: Jamie Dougherty [id number] Date: 03/25/2017  Mom had breastfed a couple times on first day, and then has been bottle feeding formula ever since. During LC rounds this morning, I acknowledged that information and asked if she switched due to any problem or concern that I may be able to help her with. She said she "didn't think she had enough milk" . I reviewed lactation process simply while showing her info in her BF booklet and shared some basic expectations of breastfeeding at this stage. I shared differences between formula and breastmilk; between bottle and breastfeeding, and that breastfeeding requires practice, time, education, support and delayed bottles, paci and formula to work well.  Baby was rooting at the time and she said she wanted help with breastfeeding after she went to the restroom. She said she would call when ready. An hour later, I checked in on her. She was in shower. Later on she called me to help with breastfeeding but had just fed him 20 ml of formula from bottle. I told her I am available to help her within the next two hours if she is still interested, but it won't work well if he gets more formula. She said she wanted to watch the BF DVD, so I left it in room for her.   Maternal Data    Feeding Feeding Type: Bottle Fed - Formula Nipple Type: Slow - flow  LATCH Score/Interventions                      Lactation Tools Discussed/Used     Consult Status      Jamie Dougherty 03/25/2017, 11:57 AM

## 2017-03-25 NOTE — Progress Notes (Signed)
Patient ID: Jamie Dougherty, female   DOB: [birth year], 31 y.o.   MRN: [id number]  Obstetric Postpartum Daily Progress Note Subjective:  31 y.o. G3P3003 postpartum day #1 status post vaginal delivery.  She is ambulating, is tolerating po, is voiding spontaneously.  Her pain is well controlled on PO pain medications. Her lochia is less than menses.   Medications SCHEDULED MEDICATIONS  . ferrous sulfate  325 mg Oral BID WC  . ibuprofen  600 mg Oral Q6H  . prenatal multivitamin  1 tablet Oral Q1200  . senna-docusate  2 tablet Oral Q24H    MEDICATION INFUSIONS    PRN MEDICATIONS  acetaminophen, benzocaine-Menthol, coconut oil, witch hazel-glycerin **AND** dibucaine, diphenhydrAMINE, HYDROcodone-acetaminophen, ondansetron **OR** ondansetron (ZOFRAN) IV, simethicone    Objective:   Vitals:   03/24/17 2010 03/24/17 2314 03/25/17 0732 03/25/17 1135  BP: 138/82 (!) 112/57 (!) 116/59   Pulse: 65 71 72   Resp: Temp:  98.2 F (36.8 C) 98 F (36.7 C) 98.1 F (36.7 C)  TempSrc:  Oral Oral Oral  SpO2: 100% 99% 99%   Weight:      Height:        Current Vital Signs 24h Vital Sign Ranges  T 98.1 F (36.7 C) Temp  Avg: 98.1 F (36.7 C)  Min: 97.9 F (36.6 C)  Max: 98.2 F (36.8 C)  BP (!) 116/59 BP  Min: 112/57  Max: 138/82  HR 72 Pulse  Avg: 71.2  Min: 65  Max: 75  RR 17 Resp  Avg: 17.8  Min: 17  Max: 18  SaO2 99 % Not Delivered SpO2  Avg: 99.4 %  Min: 99 %  Max: 100 %       24 Hour I/O Current Shift I/O  Time Ins Outs 04/02 0701 - 04/03 0700 In: 240 [P.O.:240] Out: 1100 [Urine:1100] 04/03 0701 - 04/03 1900 In: 240 [P.O.:240] Out: -   General: NAD Pulmonary: no increased work of breathing Abdomen: non-distended, non-tender, fundus firm at level of umbilicus Extremities: no edema, no erythema, no tenderness  Labs:   Recent Labs Lab 03/23/17 1214 03/24/17 0600  WBC 7.1 9.2  HGB 11.3* 10.5*  HCT 33.9* 32.1*  PLT 220 175     Assessment:   31 y.o. G3P3003  postpartum day # 1 status post SVD, doing well  Plan:   1) Acute blood loss anemia - hemodynamically stable and asymptomatic - po ferrous sulfate  2) O POS / Rubella Immune (08/29 0000)/ Varicella Immune  3) TDAP status: declined  4) breast and bottle /Contraception = undecided, considering nexplanon  5) Disposition: home tomorrow  Thomasene Mohair, MD 03/25/2017 12:25 PM

## 2017-03-25 NOTE — Anesthesia Postprocedure Evaluation (Signed)
Anesthesia Post Note  Patient: Jamie Dougherty  Procedure(s) Performed: * No procedures listed *  Patient location during evaluation: Mother Baby Anesthesia Type: Epidural Level of consciousness: awake, awake and alert and oriented Pain management: pain level controlled Vital Signs Assessment: post-procedure vital signs reviewed and stable Respiratory status: spontaneous breathing Cardiovascular status: blood pressure returned to baseline Postop Assessment: no signs of nausea or vomiting, adequate PO intake and epidural receding Anesthetic complications: no     Last Vitals:  Vitals:   03/24/17 2314 03/25/17 0732  BP: (!) 112/57 (!) 116/59  Pulse: 71 72  Resp: 18 17  Temp: 36.8 C 36.7 C    Last Pain:  Vitals:   03/25/17 0732  TempSrc: Oral  PainSc:                  [name]

## 2017-03-25 NOTE — Lactation Note (Signed)
This note was copied from a baby's chart. Lactation Consultation Note  Patient Name: Jamie Dougherty [id number] Date: 03/25/2017 Reason for consult: Initial assessment Mom called me back for breastfeeding help over an hour ago. Baby rooting. Mom positioned him in cradle hold well enough, but without any support so baby was flailing a bit. Pillow support help to correct that. He latched to left side well but needed guidance to stay on. I showed her how breast compression can help him get more milk in his mouth to help him be more interested in nursing longer. We listened for swallows that were most audible with breast compression. He nursed 10 minutes on that side and kept rooting. We tried him nursing on right side, but right nipple flattened with any compression. She pumped with her Spectra travel pump for a few minutes to evert nipple and get some colostrum. He latched better briefly, but kept losing latch and getting very fussy. I saw Mom and baby getting more frustrated, so offered nipple shield (he already had many bottles and paci). This worked briefly, and he fussed until I instilled a couple mls of formula into shield. It got him to start strong bursts of suck swallow. Soon, Mom was able to keep him nursing with breast compression alone for 15 minutes. He fell asleep...briefly. He rooted again so she placed him in football hold on left and latched him on without problem. Audible swallows with breast compression and skin to skin care. Lots of uterine cramping. Signs of good BF and nourishment reviewed with her booklet. She watched the breastfeeding DVD I brought her. She demonstrated correct placement of 20 mm nipple shield. To use only when needed to right side. Mom states this was her best breastfeeding session with him.   Maternal Data    Feeding Feeding Type: Breast Fed Nipple Type: Slow - flow Length of feed: 15 min  LATCH Score/Interventions Latch: Grasps breast easily, tongue down, lips  flanged, rhythmical sucking. (left breast easy latch) Intervention(s): Adjust position;Assist with latch;Breast massage  Audible Swallowing: A few with stimulation Intervention(s): Hand expression  Type of Nipple: Everted at rest and after stimulation (left everts) Intervention(s): Reverse pressure  Comfort (Breast/Nipple): Soft / non-tender     Hold (Positioning): No assistance needed to correctly position infant at breast. Intervention(s): Breastfeeding basics reviewed;Support Pillows;Position options  LATCH Score: 9  Lactation Tools Discussed/Used Pump Review: Setup, frequency, and cleaning;Milk Storage Initiated by:: Jamie Getchell rn IBCLC Date initiated:: 03/25/17   Consult Status Consult Status: PRN    Sunday Corn 03/25/2017, 2:33 PM

## 2017-03-26 ENCOUNTER — Encounter: Payer: 59 | Admitting: Obstetrics and Gynecology

## 2017-03-26 MED ORDER — IBUPROFEN 600 MG PO TABS: 600.0000 mg | ORAL_TABLET | Freq: Four times a day (QID) | ORAL | 0 refills | Status: DC

## 2017-03-26 NOTE — Progress Notes (Signed)
Reviewed all patients discharge instructions and handouts regarding postpartum bleeding, no intercourse for 6 weeks, signs and symptoms of mastitis and postpartum bleu's. Reviewed discharge instructions for newborn regarding proper cord care, how and when to bathe the newborn, nail care, proper way to take the baby's temperature, along with safe sleep. All questions have been answered at this time. Patient discharged via wheelchair with axillary.  

## 2017-03-26 NOTE — Lactation Note (Signed)
This note was copied from a baby's chart. Lactation Consultation Note  Patient Jamie Dougherty [id number] Date: 03/26/2017 Reason for consult: Follow-up assessment Mom mostly bottle fed formula last night, but did breastfeed some in between.  While I was in room, she chose to put baby to breast but after a few minutes, she took him off to get his photo taken.  We discussed option of pump and bottle feed baby until her supply increases to satisfy him as mom and baby get frustrated due to lack of volume and fast flow at the breast that he is used to right now. She has LC contact info and BF support group info. I showed/explained pump and storage guidelines.   Maternal Data    Feeding Feeding Type: Breast Fed Nipple Type: Slow - flow Length of feed: 5 min (stopped feed for baby photo)  LATCH Score/Interventions Latch: Grasps breast easily, tongue down, lips flanged, rhythmical sucking. (left breast; no shield)  Audible Swallowing: A few with stimulation Intervention(s): Hand expression  Type of Nipple: Everted at rest and after stimulation (on left only today) Intervention(s): Shells;Double electric pump (Shells for invert nipples given)  Comfort (Breast/Nipple): Soft / non-tender     Hold (Positioning): No assistance needed to correctly position infant at breast.  LATCH Score: 9  Lactation Tools Discussed/Used     Consult Status Consult Status: PRN    Sunday Corn 03/26/2017, 10:43 AM

## 2017-05-09 ENCOUNTER — Ambulatory Visit (INDEPENDENT_AMBULATORY_CARE_PROVIDER_SITE_OTHER): Payer: 59 | Admitting: Obstetrics and Gynecology

## 2017-05-09 DIAGNOSIS — L732 Hidradenitis suppurativa: Secondary | ICD-10-CM

## 2017-05-09 MED ORDER — CLINDAMYCIN PHOSPHATE 1 % EX GEL: Freq: Two times a day (BID) | CUTANEOUS | 11 refills | Status: DC

## 2017-05-09 NOTE — Progress Notes (Signed)
Postpartum Visit  Chief Complaint: 6 week postpartum  History of Present Illness: Patient is a 31 y.o. [id number] presents for postpartum visit.  Date of delivery: 03/24/2017 Type of delivery: Vaginal delivery - Vacuum or forceps assisted  : no Episiotomy No.  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  Newborn Details:  SINGLETON :  1. Baby's name: Fayzon. Birth weight: 6.3lb Maternal Details:  Breast Feeding:  yes Post partum depression/anxiety noted:  no Edinburgh Post-Partum Depression Score:  4-5  Date of last PAP: 05/24/2009 normal   Review of Systems: Review of Systems  Constitutional: Negative.   HENT: Negative.   Eyes: Negative.   Respiratory: Negative.   Cardiovascular: Negative.   Gastrointestinal: Negative.   Genitourinary: Negative.   Musculoskeletal: Negative.   Skin:       See HPI  Neurological: Negative.   Psychiatric/Behavioral: Negative.     Past Medical History:  Diagnosis Date  . Hidradenitis suppurativa   . History of abnormal cervical Pap smear 2008  . History of chlamydia   . History of gonorrhea   . Personal history of urinary (tract) infections     Past Surgical History:  Procedure Laterality Date  . BREAST SURGERY Left 2012   underarm  . BREAST SURGERY Right 2012  . COLPOSCOPY  2008  . PILONIDAL CYST EXCISION  2004; 2009    Family History  Problem Relation Age of Onset  . Hypertension Mother   . Hypertension Maternal Grandmother   . Colon cancer Paternal Grandfather 29    Social History:  Social History   Social History  . Marital status: Single    Spouse name: N/A  . Number of children: N/A  . Years of education: N/A   Occupational History  . Not on file.   Social History Main Topics  . Smoking status: Current Every Day Smoker    Packs/day: 0.25  . Smokeless tobacco: Never Used  . Alcohol use No  . Drug use: No  . Sexual  activity: Not on file   Other Topics Concern  . Not on file   Social History Narrative  . No narrative on file    Allergies:  Allergies  Allergen Reactions  . Sulfa Antibiotics Nausea And Vomiting and Other (See Comments)    Headache     Medications: Prior to Admission medications   Medication Sig Start Date End Date Taking? Authorizing Provider  ibuprofen (ADVIL,MOTRIN) 600 MG tablet Take 1 tablet (600 mg total) by mouth every 6 (six) hours. 03/26/17   Vena Austria, MD  Prenatal Vit-Fe Fumarate-FA (PRENATAL MULTIVITAMIN) TABS tablet Take 1 tablet by mouth daily at 12 noon.    [provider]  ranitidine (ZANTAC) 75 MG tablet Take 75 mg by mouth 2 (two) times daily.    [provider]    Physical Exam There were no vitals taken for this visit.  Physical Exam  Constitutional: She is oriented to person, place, and time. She appears well-developed and well-nourished. No distress.  Genitourinary: Uterus normal. Pelvic exam was performed with patient supine. There is no rash, tenderness, lesion or injury on the right labia. There is no rash, tenderness or injury on the left labia. No erythema, tenderness or bleeding in the vagina. No signs of injury around the vagina. No vaginal discharge found. Right adnexum does not display mass, does not display tenderness and does not display fullness. Left adnexum does not display  mass, does not display tenderness and does not display fullness. Cervix does not exhibit motion tenderness or discharge.   Uterus is mobile and anteverted. Uterus is not enlarged, tender or exhibiting a mass.  HENT:  Head: Normocephalic and atraumatic.  Eyes: EOM are normal. No scleral icterus.  Neck: Normal range of motion. Neck supple. No thyromegaly present.  Cardiovascular: Normal rate and regular rhythm.  Exam reveals no gallop and no friction rub.   No murmur heard. Pulmonary/Chest: Effort normal and breath sounds normal. No respiratory  distress. She has no wheezes. She has no rales.  Abdominal: Soft. Bowel sounds are normal. She exhibits no distension and no mass. There is no tenderness. There is no rebound and no guarding.  Musculoskeletal: Normal range of motion. She exhibits no edema or tenderness.  Lymphadenopathy:    She has no cervical adenopathy.       Right: No inguinal adenopathy present.       Left: No inguinal adenopathy present.  Neurological: She is alert and oriented to person, place, and time. No cranial nerve deficit.  Skin:  Bilateral axillae with multiple, coalescing pustules, tender, ~1cm greatest size  Psychiatric: She has a normal mood and affect. Her behavior is normal. Judgment normal.     Female Chaperone present during breast and/or pelvic exam.  Assessment: 31 y.o. [id number] presenting for 6 week postpartum visit, worsening grade 1 Hidradenitis suppurative.   Plan:   1) Contraception: plans condoms for now  2)  Pap - ASCCP guidelines and rational discussed.  Due next year  3) Patient underwent screening for postpartum depression with no concerns noted.  4) hidradenitis: topoical clindamycin 1% bid since is breastfeeding. Consider systemic treatment if no improvement.  5) Follow up 1 year for routine annual exam  Thomasene MohairStephen Beauregard Jarrells, MD 05/09/2017 9:48 AM

## 2017-05-15 ENCOUNTER — Ambulatory Visit: Payer: 59 | Admitting: Obstetrics and Gynecology

## 2017-07-03 ENCOUNTER — Telehealth: Payer: Self-pay

## 2017-07-03 DIAGNOSIS — L732 Hidradenitis suppurativa: Secondary | ICD-10-CM

## 2017-07-03 NOTE — Telephone Encounter (Signed)
Pt called triage line stating she needs a refill on the [facility]  She had her postpartum with SDJ in May.

## 2017-07-04 DIAGNOSIS — L732 Hidradenitis suppurativa: Secondary | ICD-10-CM | POA: Insufficient documentation

## 2017-07-04 NOTE — Telephone Encounter (Signed)
My records indicate that [facility]  Can you verify this?

## 2017-07-08 NOTE — Telephone Encounter (Signed)
Pt aware via vm she should have refills and to check with pharm

## 2017-07-16 ENCOUNTER — Emergency Department
Admission: EM | Admit: 2017-07-16 | Discharge: 2017-07-16 | Disposition: A | Discharge status: Home / Self Care | Payer: Commercial Managed Care - PPO | Source: Intra-hospital | Attending: Registered Nurse | Admitting: Registered Nurse

## 2018-02-18 ENCOUNTER — Other Ambulatory Visit: Payer: Self-pay | Admitting: Internal Medicine

## 2018-02-18 DIAGNOSIS — E049 Nontoxic goiter, unspecified: Secondary | ICD-10-CM

## 2018-02-23 ENCOUNTER — Ambulatory Visit: Payer: Medicaid Other | Attending: Internal Medicine

## 2018-03-06 ENCOUNTER — Ambulatory Visit
Admission: RE | Admit: 2018-03-06 | Discharge: 2018-03-06 | Disposition: A | Discharge status: Home / Self Care | Payer: Medicaid Other | Source: Ambulatory Visit | Attending: Internal Medicine | Admitting: Internal Medicine

## 2018-03-06 DIAGNOSIS — E049 Nontoxic goiter, unspecified: Secondary | ICD-10-CM | POA: Insufficient documentation

## 2018-04-21 IMAGING — US US THYROID
1 series · 14 of 25 positions shown · non-contrast
Comparison: None.

CLINICAL DATA: Goiter.

EXAM:
THYROID ULTRASOUND
TECHNIQUE: Ultrasound examination of the thyroid gland and adjacent soft
tissues was performed.

[Series 1: us thyroid · 0.07mm/px · 14 of 41 slices shown]
[im 1/41]
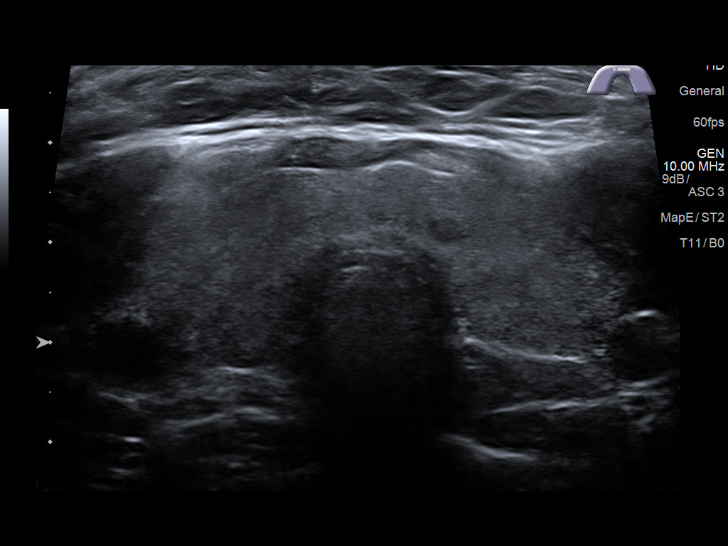
[im 4/41]
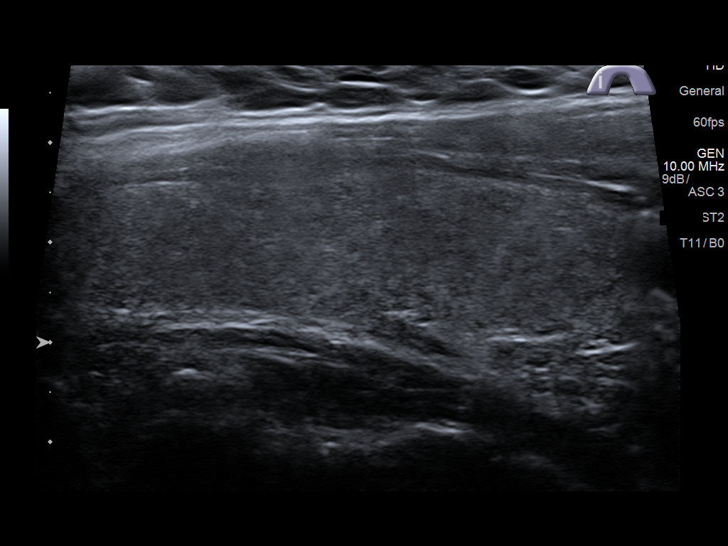
[im 7/41]
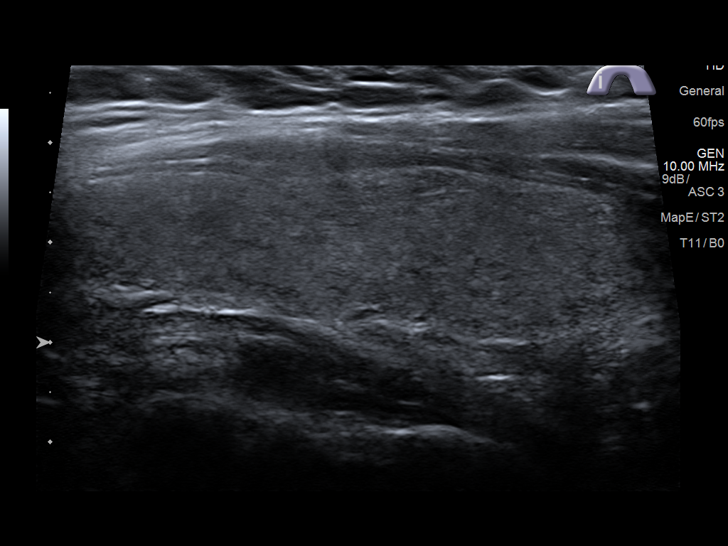
[im 11/41]
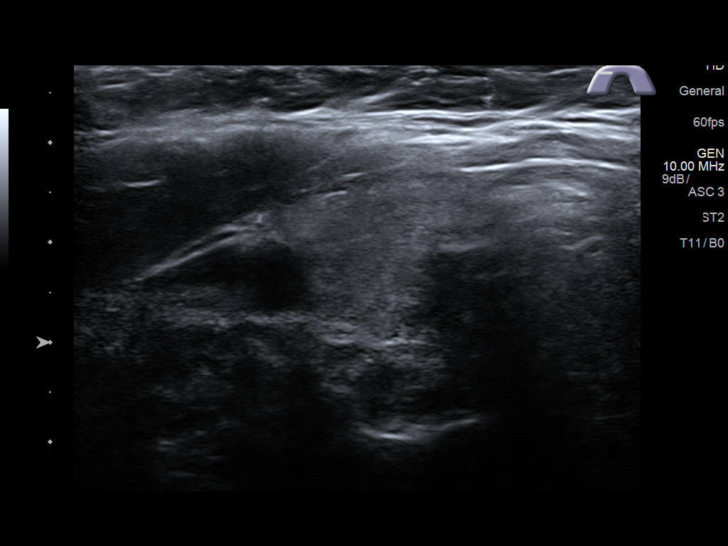
[im 14/41]
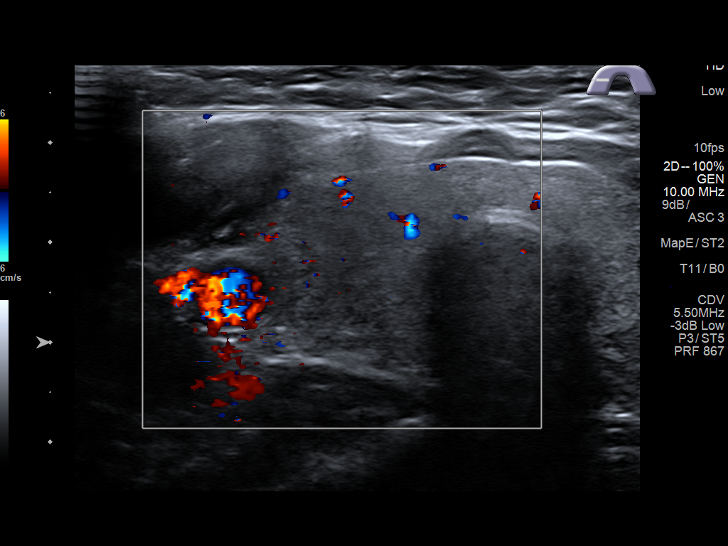
[im 16/41]
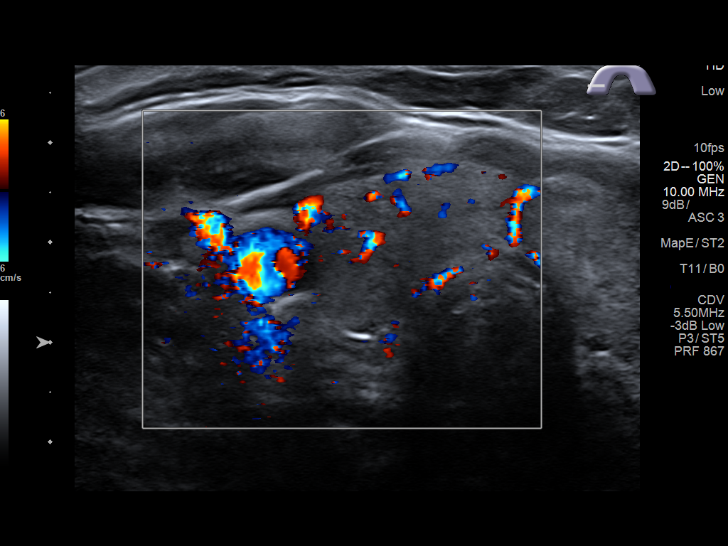
[im 19/41]
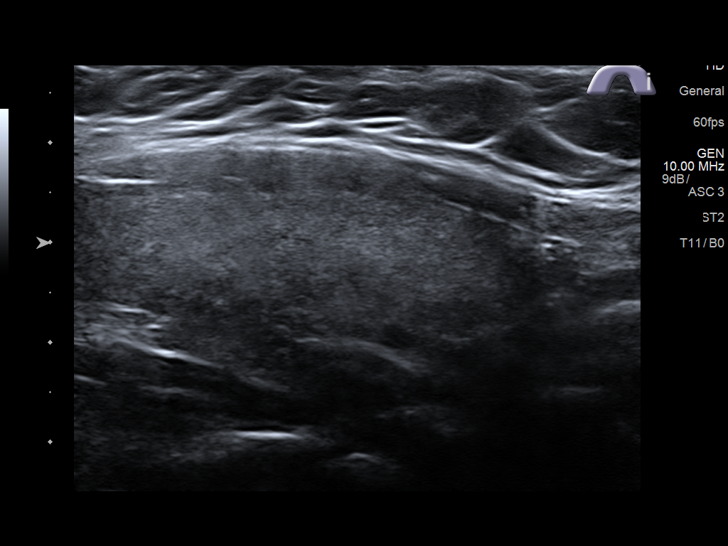
[im 22/41]
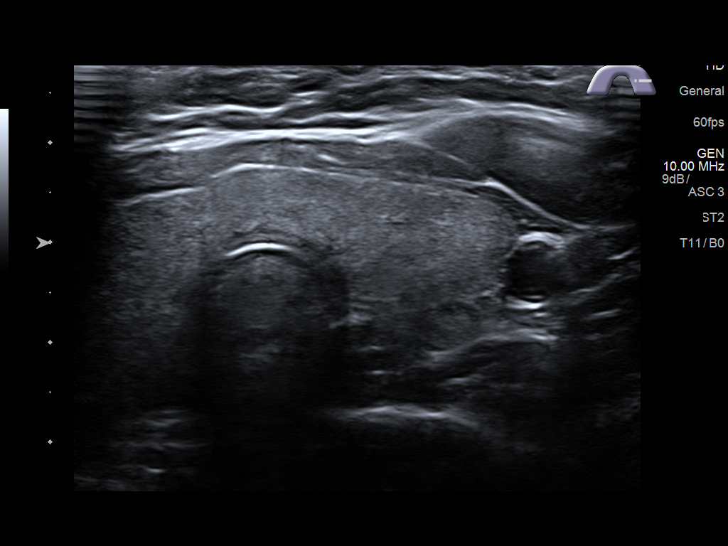
[im 26/41]
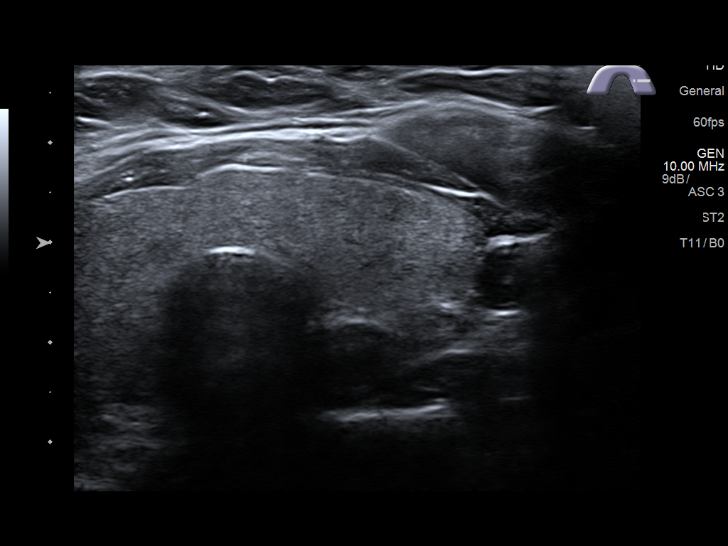
[im 27/41]
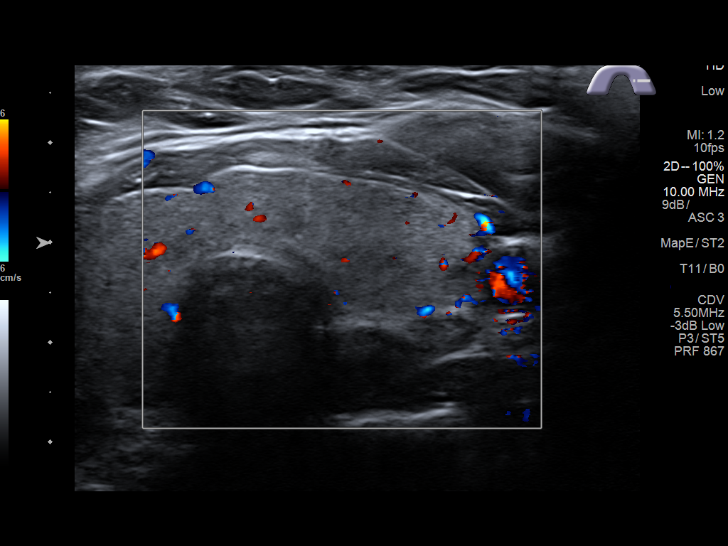
[im 31/41]
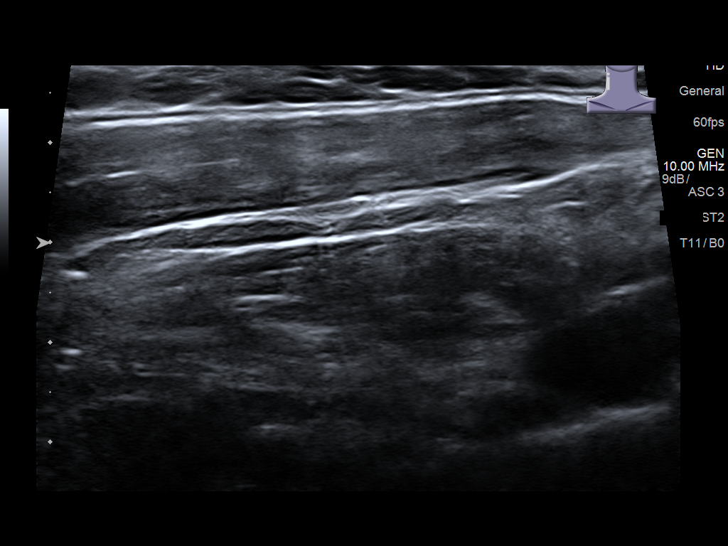
[im 34/41]
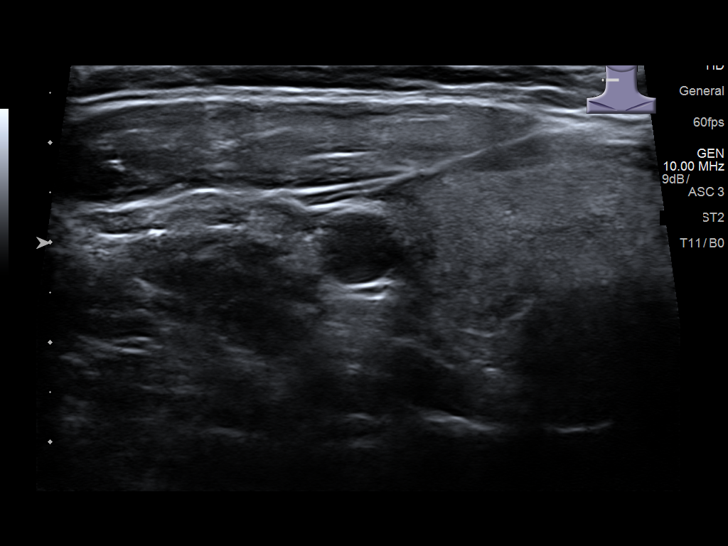
[im 37/41]
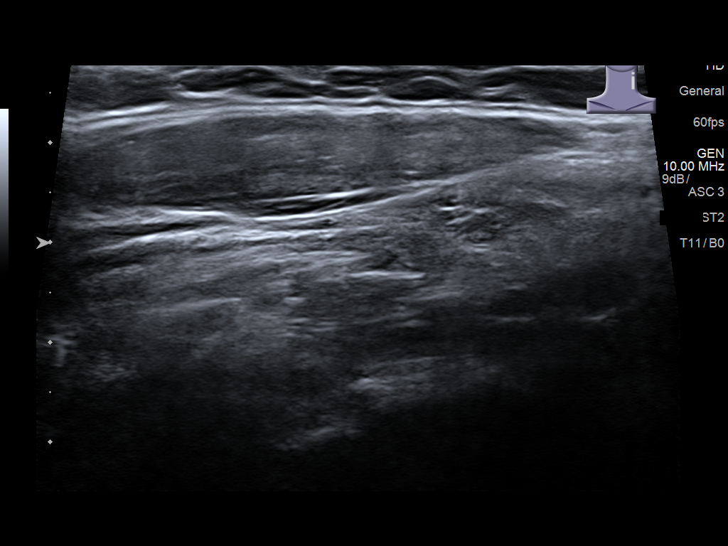
[im 41/41]
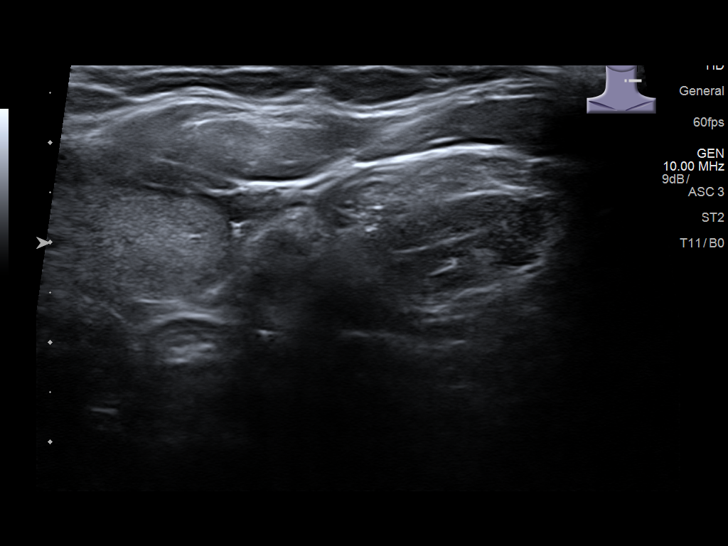

[14 of 25 positions shown; findings below may reference images not displayed]

FINDINGS: Parenchymal Echotexture: Mildly heterogenous - no definitive
evidence of glandular hyperemia

Isthmus: Mildly enlarged measuring 0.7 cm in diameter

Right lobe: Mildly enlarged measuring 6.0 x 2.2 x 2.0 cm

Left lobe: Mildly enlarged in size measuring 5.4 x 2.0 x 0.9 cm

_________________________________________________________

Estimated total number of nodules >/= 1 cm: 0

Number of spongiform nodules >/=  2 cm not described below (TR1): 0

Number of mixed cystic and solid nodules >/= 1.5 cm not described
below (TR2): 0

_________________________________________________________

No discrete nodules are seen within the thyroid gland.
IMPRESSION: Mildly enlarged and heterogeneous appearing thyroid gland without
discrete nodule mass.

## 2019-12-20 ENCOUNTER — Emergency Department
Admit: 2019-12-20 | Discharge: 2019-12-21 | Disposition: A | Discharge status: Home / Self Care | Payer: PRIVATE HEALTH INSURANCE | Attending: Family

## 2019-12-20 ENCOUNTER — Encounter
Admit: 2019-12-20 | Discharge: 2019-12-21 | Disposition: A | Discharge status: Home / Self Care | Payer: PRIVATE HEALTH INSURANCE | Attending: Family

## 2019-12-20 DIAGNOSIS — L732 Hidradenitis suppurativa: Principal | ICD-10-CM

## 2019-12-20 DIAGNOSIS — N611 Abscess of the breast and nipple: Principal | ICD-10-CM

## 2019-12-20 MED ORDER — IBUPROFEN 800 MG TABLET
ORAL_TABLET | Freq: Three times a day (TID) | ORAL | 0 refills | 10.00000 days | Status: CP | PRN
Start: 2019-12-20 — End: 2019-12-20

## 2019-12-20 MED ORDER — HYDROCODONE 5 MG-ACETAMINOPHEN 325 MG TABLET: 1 | tablet | Freq: Four times a day (QID) | 0 refills | 3 days | Status: AC

## 2019-12-20 MED ORDER — DOXYCYCLINE HYCLATE 100 MG CAPSULE
ORAL_CAPSULE | Freq: Two times a day (BID) | ORAL | 0 refills | 10.00000 days | Status: CP
Start: 2019-12-20 — End: 2019-12-30

## 2019-12-20 MED ORDER — CLINDAMYCIN 1 % TOPICAL GEL, ONCE DAILY
0 refills | 0.00000 days | Status: CP
Start: 2019-12-20 — End: 2019-12-20

## 2019-12-20 MED ORDER — CLINDAMYCIN 1 % TOPICAL GEL, ONCE DAILY: mL | 0 refills | 0 days | Status: AC

## 2019-12-20 MED ORDER — HYDROCODONE 5 MG-ACETAMINOPHEN 325 MG TABLET
ORAL_TABLET | Freq: Four times a day (QID) | ORAL | 0 refills | 3.00000 days | Status: CP | PRN
Start: 2019-12-20 — End: 2019-12-20

## 2019-12-20 MED ORDER — IBUPROFEN 800 MG TABLET: 800 mg | tablet | Freq: Three times a day (TID) | 0 refills | 10 days | Status: AC

## 2019-12-20 MED ORDER — DOXYCYCLINE HYCLATE 100 MG CAPSULE: 100 mg | capsule | Freq: Two times a day (BID) | 0 refills | 10 days | Status: AC

## 2019-12-30 ENCOUNTER — Encounter: Admit: 2019-12-30 | Discharge: 2019-12-31 | Payer: PRIVATE HEALTH INSURANCE

## 2019-12-30 DIAGNOSIS — L0291 Cutaneous abscess, unspecified: Principal | ICD-10-CM

## 2019-12-30 DIAGNOSIS — L732 Hidradenitis suppurativa: Principal | ICD-10-CM

## 2019-12-30 DIAGNOSIS — N611 Abscess of the breast and nipple: Principal | ICD-10-CM

## 2019-12-30 MED ORDER — DOXYCYCLINE HYCLATE 100 MG CAPSULE
ORAL_CAPSULE | Freq: Two times a day (BID) | ORAL | 0 refills | 10.00000 days | Status: CP
Start: 2019-12-30 — End: 2020-01-09

## 2020-01-04 ENCOUNTER — Ambulatory Visit
Admit: 2020-01-04 | Discharge: 2020-01-05 | Payer: PRIVATE HEALTH INSURANCE | Attending: Dermatology | Primary: Dermatology

## 2020-01-04 DIAGNOSIS — N611 Abscess of the breast and nipple: Principal | ICD-10-CM

## 2020-01-04 DIAGNOSIS — L732 Hidradenitis suppurativa: Principal | ICD-10-CM

## 2020-01-04 DIAGNOSIS — Z79899 Other long term (current) drug therapy: Principal | ICD-10-CM

## 2020-01-04 MED ORDER — DOXYCYCLINE HYCLATE 100 MG CAPSULE
ORAL_CAPSULE | Freq: Two times a day (BID) | ORAL | 2 refills | 30.00000 days | Status: CP
Start: 2020-01-04 — End: 2020-01-14

## 2020-01-04 MED ORDER — SPIRONOLACTONE 100 MG TABLET
ORAL_TABLET | 3 refills | 0.00000 days | Status: CP
Start: 2020-01-04 — End: ?

## 2020-01-04 MED ORDER — METFORMIN ER 500 MG TABLET,EXTENDED RELEASE 24 HR
ORAL_TABLET | Freq: Every day | ORAL | 6 refills | 30.00000 days | Status: CP
Start: 2020-01-04 — End: 2021-01-03

## 2020-03-30 ENCOUNTER — Ambulatory Visit
Admit: 2020-03-30 | Discharge: 2020-03-31 | Payer: PRIVATE HEALTH INSURANCE | Attending: Dermatology | Primary: Dermatology

## 2020-03-30 DIAGNOSIS — L732 Hidradenitis suppurativa: Principal | ICD-10-CM

## 2020-03-30 MED ORDER — PREDNISONE 20 MG TABLET
ORAL_TABLET | ORAL | 0 refills | 12.00000 days | Status: CP
Start: 2020-03-30 — End: 2020-04-11

## 2020-03-30 MED ORDER — DOXYCYCLINE HYCLATE 100 MG TABLET
ORAL_TABLET | Freq: Two times a day (BID) | ORAL | 2 refills | 30.00000 days | Status: CP
Start: 2020-03-30 — End: ?

## 2020-03-30 MED ORDER — IBUPROFEN 800 MG TABLET
ORAL_TABLET | Freq: Three times a day (TID) | ORAL | 4 refills | 20.00000 days | Status: CP | PRN
Start: 2020-03-30 — End: ?

## 2020-05-02 DIAGNOSIS — L732 Hidradenitis suppurativa: Principal | ICD-10-CM

## 2020-05-02 MED ORDER — SPIRONOLACTONE 100 MG TABLET
ORAL_TABLET | 3 refills | 0.00000 days | Status: CP
Start: 2020-05-02 — End: ?

## 2020-05-09 DIAGNOSIS — L732 Hidradenitis suppurativa: Principal | ICD-10-CM

## 2020-05-09 MED ORDER — PREDNISONE 20 MG TABLET
ORAL_TABLET | ORAL | 0 refills | 12.00000 days | Status: CP
Start: 2020-05-09 — End: 2020-05-21

## 2020-07-13 ENCOUNTER — Encounter
Admit: 2020-07-13 | Discharge: 2020-07-14 | Payer: PRIVATE HEALTH INSURANCE | Attending: Dermatology | Primary: Dermatology

## 2020-07-20 ENCOUNTER — Ambulatory Visit
Admit: 2020-07-20 | Discharge: 2020-07-21 | Payer: PRIVATE HEALTH INSURANCE | Attending: Dermatology | Primary: Dermatology

## 2020-07-20 DIAGNOSIS — L732 Hidradenitis suppurativa: Principal | ICD-10-CM

## 2020-11-09 ENCOUNTER — Encounter
Admit: 2020-11-09 | Discharge: 2020-11-10 | Payer: PRIVATE HEALTH INSURANCE | Attending: Dermatology | Primary: Dermatology

## 2020-11-09 DIAGNOSIS — L732 Hidradenitis suppurativa: Principal | ICD-10-CM

## 2021-04-05 DIAGNOSIS — L732 Hidradenitis suppurativa: Principal | ICD-10-CM

## 2021-04-10 MED ORDER — IBUPROFEN 800 MG TABLET
ORAL_TABLET | Freq: Three times a day (TID) | ORAL | 0 refills | 20.00000 days | Status: CP | PRN
Start: 2021-04-10 — End: ?

## 2021-07-16 DIAGNOSIS — L732 Hidradenitis suppurativa: Principal | ICD-10-CM

## 2021-07-22 MED ORDER — SPIRONOLACTONE 100 MG TABLET
ORAL_TABLET | 3 refills | 0.00000 days | Status: CP
Start: 2021-07-22 — End: ?

## 2021-07-26 ENCOUNTER — Encounter: Admit: 2021-07-26 | Discharge: 2021-07-27 | Payer: PRIVATE HEALTH INSURANCE

## 2021-07-26 ENCOUNTER — Encounter
Admit: 2021-07-26 | Discharge: 2021-07-27 | Payer: PRIVATE HEALTH INSURANCE | Attending: Dermatology | Primary: Dermatology

## 2021-07-26 DIAGNOSIS — L732 Hidradenitis suppurativa: Principal | ICD-10-CM

## 2021-07-27 MED ORDER — DOXYCYCLINE HYCLATE 100 MG TABLET
ORAL_TABLET | Freq: Two times a day (BID) | ORAL | 0 refills | 30.00000 days | Status: CP
Start: 2021-07-27 — End: ?

## 2021-07-27 MED ORDER — IBUPROFEN 800 MG TABLET
ORAL_TABLET | Freq: Three times a day (TID) | ORAL | 0 refills | 8.00000 days | Status: CP | PRN
Start: 2021-07-27 — End: ?

## 2021-07-27 MED ORDER — SPIRONOLACTONE 100 MG TABLET
ORAL_TABLET | 3 refills | 0.00000 days | Status: CP
Start: 2021-07-27 — End: ?

## 2021-08-07 ENCOUNTER — Encounter (HOSPITAL_COMMUNITY): Payer: Self-pay

## 2021-08-07 ENCOUNTER — Other Ambulatory Visit: Payer: Self-pay

## 2021-08-07 ENCOUNTER — Emergency Department (HOSPITAL_COMMUNITY)
Admission: EM | Admit: 2021-08-07 | Discharge: 2021-08-07 | Disposition: A | Discharge status: Home / Self Care | Payer: Medicaid Other | Attending: Emergency Medicine | Admitting: Emergency Medicine

## 2021-08-07 DIAGNOSIS — Z5321 Procedure and treatment not carried out due to patient leaving prior to being seen by health care provider: Secondary | ICD-10-CM | POA: Diagnosis not present

## 2021-08-07 DIAGNOSIS — G43909 Migraine, unspecified, not intractable, without status migrainosus: Secondary | ICD-10-CM | POA: Insufficient documentation

## 2021-08-07 NOTE — ED Provider Notes (Signed)
Emergency Medicine Provider Triage Evaluation Note  Jamie Dougherty , a 35 y.o. female  was evaluated in triage.  Pt complains of headache that began at 12:30 PM today.  Gradual onset, left frontal/orbital region, worse with bright lights, no alleviating factors.  Tried ibuprofen without much change.  Associated nausea without vomiting.  Has had similar headaches in the past.  Review of Systems  Positive: headache Negative: Unilateral numbness/weakness, diplopia, vomiting.   Physical Exam  BP 134/82 (BP Location: Left Arm)   Pulse 69   Temp 98.7 F (37.1 C) (Oral)   Resp 14   SpO2 99%  Gen:   Awake, no distress   Resp:  Normal effort  MSK:   Moves extremities without difficulty Other:  PERRL. Clear speech.   Medical Decision Making  Medically screening exam initiated at 5:11 PM.  Appropriate orders placed.  Jamie Dougherty was informed that the remainder of the evaluation will be completed by another provider, this initial triage assessment does not replace that evaluation, and the importance of remaining in the ED until their evaluation is complete.  Headache.    Jamie Dougherty 08/07/21 1714    Jamie Barrette, MD 08/26/21 1433

## 2021-08-07 NOTE — ED Triage Notes (Signed)
Pt reports migraine that began around 12p today with generalized weakness and spotty vision.

## 2021-08-07 NOTE — ED Notes (Signed)
Pt called for vitals, currently not present or visible in ED lobby. Called x2.

## 2021-08-07 NOTE — ED Notes (Signed)
Pt has not been present or responded to name call for vitals, called x3, triage RN notified.

## 2021-08-21 ENCOUNTER — Ambulatory Visit: Admit: 2021-08-21 | Payer: PRIVATE HEALTH INSURANCE | Attending: Dermatology | Primary: Dermatology

## 2021-09-20 ENCOUNTER — Ambulatory Visit
Admit: 2021-09-20 | Discharge: 2021-09-21 | Payer: PRIVATE HEALTH INSURANCE | Attending: Dermatology | Primary: Dermatology

## 2021-09-20 DIAGNOSIS — L732 Hidradenitis suppurativa: Principal | ICD-10-CM

## 2021-09-20 MED ORDER — CLINDAMYCIN HCL 300 MG CAPSULE
ORAL_CAPSULE | Freq: Four times a day (QID) | ORAL | 2 refills | 15.00000 days | Status: CP
Start: 2021-09-20 — End: ?

## 2021-09-20 MED ORDER — RIFAMPIN 300 MG CAPSULE
ORAL_CAPSULE | Freq: Two times a day (BID) | ORAL | 2 refills | 30.00000 days | Status: CP
Start: 2021-09-20 — End: 2021-09-25

## 2021-09-20 MED ORDER — HYDROCODONE 5 MG-ACETAMINOPHEN 325 MG TABLET
ORAL_TABLET | Freq: Four times a day (QID) | ORAL | 0 refills | 3.00000 days | Status: CP | PRN
Start: 2021-09-20 — End: ?

## 2021-09-24 DIAGNOSIS — L732 Hidradenitis suppurativa: Principal | ICD-10-CM

## 2021-10-22 DIAGNOSIS — L732 Hidradenitis suppurativa: Principal | ICD-10-CM

## 2021-10-24 MED ORDER — IBUPROFEN 800 MG TABLET
ORAL_TABLET | 0 refills | 0.00000 days | Status: CP
Start: 2021-10-24 — End: ?

## 2022-02-18 ENCOUNTER — Ambulatory Visit
Admit: 2022-02-18 | Discharge: 2022-02-19 | Payer: PRIVATE HEALTH INSURANCE | Attending: Internal Medicine | Primary: Internal Medicine

## 2022-02-18 DIAGNOSIS — L732 Hidradenitis suppurativa: Principal | ICD-10-CM

## 2022-02-18 MED ORDER — CLINDAMYCIN HCL 300 MG CAPSULE
ORAL_CAPSULE | Freq: Four times a day (QID) | ORAL | 2 refills | 15.00000 days | Status: CP
Start: 2022-02-18 — End: ?

## 2022-02-18 MED ORDER — IBUPROFEN 800 MG TABLET
ORAL_TABLET | Freq: Three times a day (TID) | ORAL | 0 refills | 8.00000 days | Status: CP | PRN
Start: 2022-02-18 — End: ?

## 2022-07-03 ENCOUNTER — Ambulatory Visit: Admit: 2022-07-03 | Discharge: 2022-07-03 | Payer: PRIVATE HEALTH INSURANCE

## 2022-07-03 DIAGNOSIS — L732 Hidradenitis suppurativa: Principal | ICD-10-CM

## 2022-07-03 DIAGNOSIS — L0591 Pilonidal cyst without abscess: Principal | ICD-10-CM

## 2022-07-03 DIAGNOSIS — L910 Hypertrophic scar: Principal | ICD-10-CM

## 2022-07-26 DIAGNOSIS — L732 Hidradenitis suppurativa: Principal | ICD-10-CM

## 2022-07-26 MED ORDER — IBUPROFEN 800 MG TABLET
ORAL_TABLET | Freq: Three times a day (TID) | ORAL | 2 refills | 10 days | Status: CP
Start: 2022-07-26 — End: ?

## 2022-08-19 DIAGNOSIS — L732 Hidradenitis suppurativa: Principal | ICD-10-CM

## 2022-08-19 MED ORDER — IBUPROFEN 800 MG TABLET
ORAL_TABLET | Freq: Three times a day (TID) | ORAL | 0 refills | 8.00000 days | Status: CP | PRN
Start: 2022-08-19 — End: ?

## 2022-08-19 MED ORDER — SPIRONOLACTONE 100 MG TABLET
ORAL_TABLET | 0 refills | 0.00000 days | Status: CP
Start: 2022-08-19 — End: ?

## 2022-11-20 ENCOUNTER — Ambulatory Visit: Admit: 2022-11-20 | Payer: PRIVATE HEALTH INSURANCE

## 2022-11-21 DIAGNOSIS — L732 Hidradenitis suppurativa: Principal | ICD-10-CM

## 2022-11-21 MED ORDER — SPIRONOLACTONE 100 MG TABLET
ORAL_TABLET | 0 refills | 0.00000 days | Status: CP
Start: 2022-11-21 — End: ?

## 2023-03-13 ENCOUNTER — Ambulatory Visit
Admit: 2023-03-13 | Discharge: 2023-03-13 | Payer: PRIVATE HEALTH INSURANCE | Attending: Student in an Organized Health Care Education/Training Program | Primary: Student in an Organized Health Care Education/Training Program

## 2023-03-13 ENCOUNTER — Ambulatory Visit: Admit: 2023-03-13 | Discharge: 2023-03-13 | Payer: PRIVATE HEALTH INSURANCE

## 2023-03-13 DIAGNOSIS — L732 Hidradenitis suppurativa: Principal | ICD-10-CM

## 2023-03-13 DIAGNOSIS — Z79899 Other long term (current) drug therapy: Principal | ICD-10-CM

## 2023-03-13 MED ORDER — CEFDINIR 300 MG CAPSULE
ORAL_CAPSULE | Freq: Two times a day (BID) | ORAL | 2 refills | 30.00000 days | Status: CP
Start: 2023-03-13 — End: ?

## 2023-03-13 MED ORDER — COSENTYX UNOREADY PEN 300 MG/2 ML (150 MG/ML) SUBCUTANEOUS
SUBCUTANEOUS | 11 refills | 0.00000 days | Status: CP
Start: 2023-03-13 — End: ?

## 2023-03-17 DIAGNOSIS — L732 Hidradenitis suppurativa: Principal | ICD-10-CM

## 2023-03-26 DIAGNOSIS — L732 Hidradenitis suppurativa: Principal | ICD-10-CM

## 2023-03-26 MED ORDER — HUMIRA(CF) PEN 80 MG/0.8 ML SUBCUTANEOUS KIT
SUBCUTANEOUS | 11 refills | 0.00000 days | Status: CP
Start: 2023-03-26 — End: ?

## 2023-03-31 DIAGNOSIS — L732 Hidradenitis suppurativa: Principal | ICD-10-CM

## 2023-03-31 MED ORDER — SPIRONOLACTONE 100 MG TABLET
ORAL_TABLET | 3 refills | 0.00000 days | Status: CP
Start: 2023-03-31 — End: ?

## 2023-04-01 DIAGNOSIS — L732 Hidradenitis suppurativa: Principal | ICD-10-CM

## 2023-04-02 DIAGNOSIS — L732 Hidradenitis suppurativa: Principal | ICD-10-CM

## 2023-04-02 MED ORDER — HUMIRA PEN CITRATE FREE STARTER PACK FOR CROHN'S/UC/HS 3 X 80 MG/0.8 ML
ORAL | 0 refills | 0.00000 days | Status: CP
Start: 2023-04-02 — End: ?
  Filled 2023-06-04: qty 3, 28d supply, fill #0

## 2023-04-04 NOTE — Unmapped (Signed)
[facility] Specialty Medication Onboarding    Specialty Medication: Humira  Prior Authorization: Approved   Financial Assistance: No - copay  <$25  Final Copay/Day Supply: $4 / 28 days (LD)          $4 / 28 days (MD)    Insurance Restrictions: None     Notes to Pharmacist:     The triage team has completed the benefits investigation and has determined that the patient is able to fill this medication at [facility]. Please contact the patient to complete the onboarding or follow up with the prescribing physician as needed.

## 2023-04-07 MED ORDER — EMPTY CONTAINER
2 refills | 0 days
Start: 2023-04-07 — End: ?

## 2023-04-07 NOTE — Unmapped (Unsigned)
*in addition to reviewing the information below, I also sent [name] link to an injection training video to the patient's cell (text).       [facility]   Patient Onboarding/Medication Counseling    Kathryn [name] is [name] 37 y.o. female with hidradenitis suppurativa who I am counseling today on initiation of therapy.  I am speaking to the patient.    Was [name] Nurse, learning disability used for this call? No    Verified patient's date of birth / HIPAA.    Specialty medication(s) to be sent: Inflammatory Disorders: Humira      Non-specialty medications/supplies to be sent: Sharps kit      Medications not needed at this time: na         Humira (adalimumab)    Medication & Administration     Dosage: Hidradenitis supprativa: Inject 160mg  under the skin on day 1, 80mg  on day 15, then 40mg  every 7 days starting on day 29    Lab tests required prior to treatment initiation:  Tuberculosis: Tuberculosis screening resulted in [name] non-reactive Quantiferon TB Gold assay.  Hepatitis B: Hepatitis B serology studies are complete and non-reactive.    Administration:     Prefilled auto-injector pen  1. Gather all supplies needed for injection on [name] clean, flat working surface: medication pen removed from packaging, alcohol swab, sharps container, etc.  2. Look at the medication label - look for correct medication, correct dose, and check the expiration date  3. Look at the medication - the liquid visible in the window on the side of the pen device should appear clear and colorless  4. Lay the auto-injector pen on [name] flat surface and allow it to warm up to room temperature for at least 30-45 minutes  5. Select injection site - you can use the front of your thigh or your belly (but not the area 2 inches around your belly button); if someone else is giving you the injection you can also use your upper arm in the skin covering your triceps muscle  6. Prepare injection site - wash your hands and clean the skin at the injection site with an alcohol swab and let it air dry, do not touch the injection site again before the injection  7. Pull the 2 safety caps straight off - gray/white to uncover the needle cover and the plum cap to uncover the plum activator button, do not remove until immediately prior to injection and do not touch the white needle cover  8. Gently squeeze the area of cleaned skin and hold it firmly to create [name] firm surface at the selected injection site  9. Put the white needle cover against your skin at the injection site at [name] 90 degree angle, hold the pen such that you can see the clear medication window  10. Press down and hold the pen firmly against your skin, press the plum activator button to initiate the injection, there will be [name] click when the injection starts  11. Continue to hold the pen firmly against your skin for about 10-15 seconds - the window will start to turn solid yellow  12. To verify the injection is complete after 10-15 seconds, look and ensure the window is solid yellow and then pull the pen away from your skin  13. Dispose of the used auto-injector pen immediately in your sharps disposal container the needle will be covered automatically  14. If you see any blood at the injection site, press [name] cotton ball  or gauze on the site and maintain pressure until the bleeding stops, do not rub the injection site    Adherence/Missed dose instructions:  If your injection is given more than 3 days after your scheduled injection date - consult your pharmacist for additional instructions on how to adjust your dosing schedule.    Goals of Therapy     - Reduce the frequency and severity of new lesions  - Minimize pain and suppuration  - Prevent disease progression and limit scarring  - Maintenance of effective psychosocial functioning    Side Effects & Monitoring Parameters     Injection site reaction (redness, irritation, inflammation localized to the site of administration)  Signs of [name] common cold - minor sore throat, runny or stuffy nose, etc.  Upset stomach  Headache    The following side effects should be reported to the provider:  Signs of [name] hypersensitivity reaction - rash; hives; itching; red, swollen, blistered, or peeling skin; wheezing; tightness in the chest or throat; difficulty breathing, swallowing, or talking; swelling of the mouth, face, lips, tongue, or throat; etc.  Reduced immune function - report signs of infection such as fever; chills; body aches; very bad sore throat; ear or sinus pain; cough; more sputum or change in color of sputum; pain with passing urine; wound that will not heal, etc.  Also at [name] slightly higher risk of some malignancies (mainly skin and blood cancers) due to this reduced immune function.  In the case of signs of infection - the patient should hold the next dose of Humira?? and call your primary care provider to ensure adequate medical care.  Treatment may be resumed when infection is treated and patient is asymptomatic.  Changes in skin - [name] new growth or lump that forms; changes in shape, size, or color of [name] previous mole or marking  Signs of unexplained bruising or bleeding - throwing up blood or emesis that looks like coffee grounds; black, tarry, or bloody stool; etc.  Signs of new or worsening heart failure - shortness of breath; sudden weight gain; heartbeat that is not normal; swelling in the arms or legs that is new or worse      Contraindications, Warnings, & Precautions     Have your bloodwork checked as you have been told by your prescriber  Talk with your doctor if you are pregnant, planning to become pregnant, or breastfeeding  Discuss the possible need for holding your dose(s) of Humira?? when [name] planned procedure is scheduled with the prescriber as it may delay healing/recovery timeline       Drug/Food Interactions     Medication list reviewed in Epic. The patient was instructed to inform the care team before taking any new medications or supplements. No drug interactions identified.   Talk with you prescriber or pharmacist before receiving any live vaccinations while taking this medication and after you stop taking it    Storage, Handling Precautions, & Disposal     Store this medication in the refrigerator.  Do not freeze  If needed, you may store at room temperature for up to 14 days  Store in original packaging, protected from light  Do not shake  Dispose of used syringes/pens in [name] sharps disposal container          Current Medications (including OTC/herbals), Comorbidities and Allergies     Current Outpatient Medications   Medication Sig Dispense Refill    acetaminophen (TYLENOL EXTRA STRENGTH ORAL) Take by mouth.  adalimumab (HUMIRA,CF, PEN) 80 mg/0.8 mL PnKt Inject the contents of two pens (160 mg) under the skin on day for loading dose. 2 each 0    adalimumab (HUMIRA,CF, PEN) 80 mg/0.8 mL PnKt Inject the contents of 1 pen (80 mg) under the skin every 2 weeks. Maintenance. 2 each 11    cefdinir (OMNICEF) 300 MG capsule Take 1 capsule (300 mg total) by mouth two (2) times [name] day. For 7-10 days as needed for HS flares 60 capsule 2    HUMIRA PEN CITRATE FREE STARTER PACK FOR CROHN'S/UC/HS 3 X 80 MG/0.8 ML Inject the contents of 2 pens (160 mg total) under the skin on day 1 then 80 mg every 2 weeks after 3 each 0    ibuprofen (MOTRIN) 800 MG tablet Take 1 tablet (800 mg total) by mouth Three (3) times [name] day. 30 tablet 2    spironolactone (ALDACTONE) 100 MG tablet Take 1 tablet by mouth once daily 90 tablet 3     No current facility-administered medications for this visit.       Allergies   Allergen Reactions    Sulfa (Sulfonamide Antibiotics) Dizziness, Headache and Nausea Only     Other reaction(s): Abdominal Pain, Fever       There is no problem list on file for this patient.      Reviewed and up to date in Epic.    Appropriateness of Therapy     Acute infections noted within Epic:  No active infections  Patient reported infection: None    Is medication and dose appropriate based on diagnosis and infection status? Yes    Prescription has been clinically reviewed: Yes      Baseline Quality of Life Assessment      How many days over the past month did your HS  keep you from your normal activities? For example, brushing your teeth or getting up in the morning. Patient declined to answer    Financial Information     Medication Assistance provided: Prior Authorization    Anticipated copay of $4 reviewed with patient. Verified delivery address.    Delivery Information     Scheduled delivery date: 6/12    Expected start date: 6/12      Medication will be delivered via Same Day Courier to the prescription address in [facility].  This shipment will not require [name] signature.      Explained the services we provide at [facility] and that each month we would call to set up refills.  Stressed importance of returning phone calls so that we could ensure they receive their medications in time each month.  Informed patient that we should be setting up refills 7-10 days prior to when they will run out of medication.  [name] pharmacist will reach out to perform [name] clinical assessment periodically.  Informed patient that [name] welcome packet, containing information about our pharmacy and other support services, [name] Notice of Privacy Practices, and [name] drug information handout will be sent.      The patient or caregiver noted above participated in the development of this care plan and knows that they can request review of or adjustments to the care plan at any time.      Patient or caregiver verbalized understanding of the above information as well as how to contact the pharmacy at [phone] option 4 with any questions/concerns.  The pharmacy is open Monday through Friday 8:30am-4:30pm.  [name] pharmacist is available 24/7 via pager  to answer any clinical questions they may have.    Patient Specific Needs     Does the patient have any physical, cognitive, or cultural barriers? No    Does the patient have adequate living arrangements? (i.e. the ability to store and take their medication appropriately) Yes    Did you identify any home environmental safety or security hazards? No    Patient prefers to have medications discussed with  Patient     Is the patient or caregiver able to read and understand education materials at [name] high school level or above? Yes    Patient's primary language is  English     Is the patient high risk? No    SOCIAL DETERMINANTS OF HEALTH     At the [facility], we have learned that life circumstances - like trouble affording food, housing, utilities, or transportation can affect the health of many of our patients.   That is why we wanted to ask: are you currently experiencing any life circumstances that are negatively impacting your health and/or quality of life? Patient declined to answer    Social Determinants of Health     Financial Resource Strain: Not on file   Internet Connectivity: Not on file   Food Insecurity: Not on file   Tobacco Use: High Risk (03/13/2023)    Patient History     Smoking Tobacco Use: Every Day     Smokeless Tobacco Use: Never     Passive Exposure: Current   Housing/Utilities: Not on file   Alcohol Use: Not on file   Transportation Needs: Not on file   Substance Use: Not on file   Health Literacy: Not on file   Physical Activity: Not on file   Interpersonal Safety: Not on file   Stress: Not on file   Intimate Partner Violence: Not on file   Depression: Not on file   Social Connections: Not on file       Would you be willing to receive help with any of the needs that you have identified today? Not applicable       Kathryn [name] Kathryn [name] [facility] Specialty Pharmacist you be willing to receive help with any of the needs that you have identified today? {Yes/No/Not applicable:[id number]}       Kathryn [name] Kathryn [name] [facility] Specialty Pharmacist

## 2023-06-04 MED FILL — EMPTY CONTAINER: 120 days supply | Qty: 1 | Fill #0

## 2023-06-20 NOTE — Unmapped (Signed)
[facility] Clinical Assessment & Refill Coordination Note    Kathryn [name], [place]: [birth year]  Phone: [phone] (home)     All above HIPAA information was verified with patient.     Was a Nurse, learning disability used for this call? No    Specialty Medication(s):   Inflammatory Disorders: Humira     Current Outpatient Medications   Medication Sig Dispense Refill    acetaminophen (TYLENOL EXTRA STRENGTH ORAL) Take by mouth.      adalimumab (HUMIRA,CF, PEN) 80 mg/0.8 mL PnKt Inject the contents of two pens (160 mg) under the skin on day for loading dose. 2 each 0    adalimumab (HUMIRA,CF, PEN) 80 mg/0.8 mL PnKt Inject the contents of 1 pen (80 mg) under the skin every 2 weeks. Maintenance. 2 each 11    cefdinir (OMNICEF) 300 MG capsule Take 1 capsule (300 mg total) by mouth two (2) times a day. For 7-10 days as needed for HS flares 60 capsule 2    empty container Misc Use as directed to dispose of Humira pens. 1 each 2    HUMIRA PEN CITRATE FREE STARTER PACK FOR CROHN'S/UC/HS 3 X 80 MG/0.8 ML Inject the contents of 2 pens (160 mg total) under the skin on day 1 then 1 pen (80 mg) every 2 weeks thereafter 3 each 0    ibuprofen (MOTRIN) 800 MG tablet Take 1 tablet (800 mg total) by mouth Three (3) times a day. 30 tablet 2    spironolactone (ALDACTONE) 100 MG tablet Take 1 tablet by mouth once daily 90 tablet 3     No current facility-administered medications for this visit.        Changes to medications: Deanda reports no changes at this time.    Allergies   Allergen Reactions    Sulfa (Sulfonamide Antibiotics) Dizziness, Headache and Nausea Only     Other reaction(s): Abdominal Pain, Fever       Changes to allergies: No    SPECIALTY MEDICATION ADHERENCE     Humira 80mg /0.82mL: 14 days of medicine on hand   Medication Adherence    Patient reported X missed doses in the last month: 0  Specialty Medication: Humira 80mg  1q14d  Informant: patient  Confirmed plan for next specialty medication refill: delivery by pharmacy  Refills needed for supportive medications: not needed          Specialty medication(s) dose(s) confirmed: Regimen is correct and unchanged.     Are there any concerns with adherence? No    Adherence counseling provided? Not needed    CLINICAL MANAGEMENT AND INTERVENTION      Clinical Benefit Assessment:    Do you feel the medicine is effective or helping your condition? Yes    Clinical Benefit counseling provided? Not needed    Adverse Effects Assessment:    Are you experiencing any side effects? No    Are you experiencing difficulty administering your medicine? No    Quality of Life Assessment:    Quality of Life    Rheumatology  Oncology  Dermatology  1. What impact has your specialty medication had on the symptoms of your skin condition (i.e. itchiness, soreness, stinging)?: Some  2. What impact has your specialty medication had on your comfort level with your skin?: Some  Cystic Fibrosis          How many days over the past month did your HS  keep you from your normal activities? For example, brushing your teeth  or getting up in the morning. Patient declined to answer    Have you discussed this with your provider? Not needed    Acute Infection Status:    Acute infections noted within Epic:  No active infections  Patient reported infection: None    Therapy Appropriateness:    Is therapy appropriate and patient progressing towards therapeutic goals? Yes, therapy is appropriate and should be continued    DISEASE/MEDICATION-SPECIFIC INFORMATION      For patients on injectable medications: Patient currently has 1 doses left.  Next injection is scheduled for 06/20/2023.    Chronic Inflammatory Diseases: Have you experienced any flares in the last month? No  Has this been reported to your provider? Not applicable    PATIENT SPECIFIC NEEDS     Does the patient have any physical, cognitive, or cultural barriers? No    Is the patient high risk? No    Did the patient require a clinical intervention? No    Does the patient require physician intervention or other additional services (i.e., nutrition, smoking cessation, social work)? No    SOCIAL DETERMINANTS OF HEALTH     At the [facility], we have learned that life circumstances - like trouble affording food, housing, utilities, or transportation can affect the health of many of our patients.   That is why we wanted to ask: are you currently experiencing any life circumstances that are negatively impacting your health and/or quality of life? Patient declined to answer    Social Determinants of Health     Financial Resource Strain: Not on file   Internet Connectivity: Not on file   Food Insecurity: Not on file   Tobacco Use: High Risk (03/13/2023)    Patient History     Smoking Tobacco Use: Every Day     Smokeless Tobacco Use: Never     Passive Exposure: Current   Housing/Utilities: Not on file   Alcohol Use: Not on file   Transportation Needs: Not on file   Substance Use: Not on file   Health Literacy: Not on file   Physical Activity: Not on file   Interpersonal Safety: Not on file   Stress: Not on file   Intimate Partner Violence: Not on file   Depression: Not on file   Social Connections: Not on file       Would you be willing to receive help with any of the needs that you have identified today? Not applicable       SHIPPING     Specialty Medication(s) to be Shipped:   Inflammatory Disorders: Humira    Other medication(s) to be shipped: No additional medications requested for fill at this time     Changes to insurance: No    Delivery Scheduled: Yes, Expected medication delivery date: 06/25/2023.     Medication will be delivered via Same Day Courier to the confirmed prescription address in [facility].    The patient will receive a drug information handout for each medication shipped and additional FDA Medication Guides as required.  Verified that patient has previously received a Conservation officer, historic buildings and a Surveyor, mining.    The patient or caregiver noted above participated in the development of this care plan and knows that they can request review of or adjustments to the care plan at any time.      All of the patient's questions and concerns have been addressed.    Elnora Morrison, PharmD   [facility]  Specialty Pharmacist

## 2023-06-25 MED FILL — HUMIRA(CF) PEN 80 MG/0.8 ML SUBCUTANEOUS KIT: 28 days supply | Qty: 2 | Fill #0

## 2023-07-30 MED FILL — HUMIRA(CF) PEN 80 MG/0.8 ML SUBCUTANEOUS KIT: 28 days supply | Qty: 2 | Fill #1

## 2023-07-30 NOTE — Unmapped (Signed)
[facility] Refill Coordination Note    Specialty Medication(s) to be Shipped:   Inflammatory Disorders: Humira    Other medication(s) to be shipped: No additional medications requested for fill at this time     Kathryn [name], DOB: 1986-08-11  Phone: [phone] (home)       All above HIPAA information was verified with patient.     Was a Nurse, learning disability used for this call? No    Completed refill call assessment today to schedule patient's medication shipment from the [facility] [phone]).  All relevant notes have been reviewed.     Specialty medication(s) and dose(s) confirmed: Regimen is correct and unchanged.   Changes to medications: Kathryn [name] reports no changes at this time.  Changes to insurance: No  New side effects reported not previously addressed with a pharmacist or physician: None reported  Questions for the pharmacist: No    Confirmed patient received a Conservation officer, historic buildings and a Surveyor, mining with first shipment. The patient will receive a drug information handout for each medication shipped and additional FDA Medication Guides as required.       DISEASE/MEDICATION-SPECIFIC INFORMATION        For patients on injectable medications: Patient currently has 0 doses left.  Next injection is scheduled for asap.    SPECIALTY MEDICATION ADHERENCE     Medication Adherence    Patient reported X missed doses in the last month: 0  Specialty Medication: HUMIRA(CF) PEN 80 mg/0.8 mL Pnkt (adalimumab)  Patient is on additional specialty medications: No              Were doses missed due to medication being on hold? No     HUMIRA(CF) PEN 80 mg/0.8 mL Pnkt (adalimumab) : 0 days of medicine on hand       REFERRAL TO PHARMACIST     Referral to the pharmacist: Not needed      [facility]     Shipping address confirmed in Epic.       Delivery Scheduled: Yes, Expected medication delivery date: 8/7.     Medication will be delivered via Same Day Courier to the prescription address in Epic WAM.    Kathryn [name] Shared [facility] Specialty Technician

## 2023-08-21 NOTE — Unmapped (Signed)
[facility] Refill Coordination Note    Specialty Medication(s) to be Shipped:   Inflammatory Disorders: Humira    Other medication(s) to be shipped: No additional medications requested for fill at this time     Kathryn [name], DOB: [birth year]  Phone: [phone] (home)       All above HIPAA information was verified with patient. *Prescription address updated in WAM    Was a translator used for this call? No    Completed refill call assessment today to schedule patient's medication shipment from the [facility] [phone]).  All relevant notes have been reviewed.     Specialty medication(s) and dose(s) confirmed: Regimen is correct and unchanged.   Changes to medications: Kathryn [name] reports no changes at this time.  Changes to insurance: No  New side effects reported not previously addressed with a pharmacist or physician: None reported  Questions for the pharmacist: No    Confirmed patient received a Conservation officer, historic buildings and a Surveyor, mining with first shipment. The patient will receive a drug information handout for each medication shipped and additional FDA Medication Guides as required.       DISEASE/MEDICATION-SPECIFIC INFORMATION        For patients on injectable medications: Patient currently has 0 doses left.  Next injection is scheduled for 9/5.    SPECIALTY MEDICATION ADHERENCE     Medication Adherence    Patient reported X missed doses in the last month: 0  Specialty Medication: adalimumab (HUMIRA,CF, PEN) 80 mg/0.8 mL PnKt  Patient is on additional specialty medications: No  Informant: patient              Were doses missed due to medication being on hold? No     HUMIRA(CF) PEN 80 mg/0.8 mL Pnkt (adalimumab) : 0 days of medicine on hand       REFERRAL TO PHARMACIST     Referral to the pharmacist: Not needed      [facility]     Shipping address confirmed in Epic.       Delivery Scheduled: Yes, Expected medication delivery date: 9/4.     Medication will be delivered via UPS to the prescription address in Epic WAM.    [name]   [facility] Specialty Technician

## 2023-08-26 MED FILL — HUMIRA(CF) PEN 80 MG/0.8 ML SUBCUTANEOUS KIT: 28 days supply | Qty: 2 | Fill #2

## 2023-09-12 NOTE — Unmapped (Signed)
[facility] Delivery Pharm[facility] Refill Coordination Note    Specialty Medication(s) to be Shipped:   Inflammatory Disorders: Humira    Other medication(s) to be shipped: No additional medications requested for fill at this time     Kathryn [name], DOB: 1986/01/22  Phone: [phone] (home)       All above HIPAA information was verified with patient.     Was a Nurse, learning disability used for this call? No    Completed refill call assessment today to schedule patient's medication shipment from the [facility] Delivery Pharmacy  [phone]).  All relevant notes have been reviewed.     Specialty medication(s) and dose(s) confirmed: Patient reports changes to the regimen as follows: Kathryn [name] left injection out for a week and did not take it. She will take this as soon as she receives it next week.No side effects to report.    Changes to medications: Kathryn [name] reports no changes at this time.  Changes to insurance: No  New side effects reported not previously addressed with a pharmacist or physician: None reported  Questions for the pharmacist: No    Confirmed patient received a Conservation officer, historic buildings and a Surveyor, mining with first shipment. The patient will receive a drug information handout for each medication shipped and additional FDA Medication Guides as required.       DISEASE/MEDICATION-SPECIFIC INFORMATION        For patients on injectable medications: Patient currently has 0 doses left.  Next injection is scheduled for 09/02/23.    SPECIALTY MEDICATION ADHERENCE     Medication Adherence    Patient reported X missed doses in the last month: 1  Specialty Medication: HUMIRA(CF) PEN 80 mg/0.8 mL Pnkt (adalimumab)  Patient is on additional specialty medications: No  Patient is on more than two specialty medications: No  Any gaps in refill history greater than 2 weeks in the last 3 months: no  Demonstrates understanding of importance of adherence: yes  Informant: patient  Reliability of informant: reliable  Provider-estimated medication adherence level: good  Patient is at risk for Non-Adherence: No  Reasons for non-adherence: no problems identified              Were doses missed due to medication being on hold? No    HUMIRA(CF) PEN 80 mg/0.8 mL Pnkt (adalimumab)  : 0 days of medicine on hand       REFERRAL TO PHARMACIST     Referral to the pharmacist: Not needed      [facility]     Shipping address confirmed in Epic.       Delivery Scheduled: Yes, Expected medication delivery date: 09/16/23.     Medication will be delivered via UPS to the prescription address in Epic WAM.    [facility]ryn [name][name]  Specialty Technician

## 2023-09-15 MED FILL — HUMIRA(CF) PEN 80 MG/0.8 ML SUBCUTANEOUS KIT: 28 days supply | Qty: 2 | Fill #3

## 2023-10-09 NOTE — Unmapped (Signed)
[facility] Specialty and Home Delivery Pharmacy Refill Coordination Note    Specialty Medication(s) to be Shipped:   Inflammatory Disorders: Humira    Other medication(s) to be shipped: No additional medications requested for fill at this time     Kathryn [name], DOB: April 17, 1986  Phone: [phone] (home)       All above HIPAA information was verified with patient.     Was a Nurse, learning disability used for this call? No    Completed refill call assessment today to schedule patient's medication shipment from the [facility] and Home Delivery Pharmacy  [phone]).  All relevant notes have been reviewed.     Specialty medication(s) and dose(s) confirmed: Regimen is correct and unchanged.   Changes to medications: Kathryn [name] reports no changes at this time.  Changes to insurance: No  New side effects reported not previously addressed with a pharmacist or physician: None reported  Questions for the pharmacist: No    Confirmed patient received a Conservation officer, historic buildings and a Surveyor, mining with first shipment. The patient will receive a drug information handout for each medication shipped and additional FDA Medication Guides as required.       DISEASE/MEDICATION-SPECIFIC INFORMATION        For patients on injectable medications: Patient currently has 1 doses left.  Next injection is scheduled for 10/09/23.    SPECIALTY MEDICATION ADHERENCE     Medication Adherence    Patient reported X missed doses in the last month: 1-2  Specialty Medication: adalimumab (HUMIRA,CF, PEN) 80 mg/0.8 mL PnKt  Patient is on additional specialty medications: No  Informant: patient   Other non-adherence reason: delayed a week due to patietn moving                 Were doses missed due to medication being on hold? No    HUMIRA(CF) PEN 80 mg/0.8 mL Pnkt (adalimumab)  : 1 days of medicine on hand       REFERRAL TO PHARMACIST     Referral to the pharmacist: Not needed      [facility]     Shipping address confirmed in Epic.       Delivery Scheduled: Yes, Expected medication delivery date: 10/14/23.     Medication will be delivered via UPS to the prescription address in Epic WAM.    Kathryn [name] Specialty and [facility]

## 2023-10-14 MED FILL — HUMIRA(CF) PEN 80 MG/0.8 ML SUBCUTANEOUS KIT: 28 days supply | Qty: 2 | Fill #4

## 2023-10-28 ENCOUNTER — Ambulatory Visit (INDEPENDENT_AMBULATORY_CARE_PROVIDER_SITE_OTHER): Payer: Medicaid Other | Admitting: Internal Medicine

## 2023-10-28 ENCOUNTER — Encounter: Payer: Self-pay | Admitting: Cardiology

## 2023-10-28 VITALS — BP 128/71 | HR 78 | Ht 65.0 in | Wt 217.8 lb

## 2023-10-28 DIAGNOSIS — Z13220 Encounter for screening for lipoid disorders: Secondary | ICD-10-CM | POA: Diagnosis not present

## 2023-10-28 DIAGNOSIS — Z1329 Encounter for screening for other suspected endocrine disorder: Secondary | ICD-10-CM | POA: Diagnosis not present

## 2023-10-28 DIAGNOSIS — Z0000 Encounter for general adult medical examination without abnormal findings: Secondary | ICD-10-CM

## 2023-10-28 DIAGNOSIS — Z131 Encounter for screening for diabetes mellitus: Secondary | ICD-10-CM

## 2023-10-28 NOTE — Progress Notes (Signed)
Complete physical exam  Patient: Jamie Dougherty   DOB: [birth year]   37 y.o. Female  MRN: [id number]  Subjective:    Chief Complaint  Patient presents with   Follow-up    Jamie Dougherty is a 37 y.o. female who presents today for a complete physical exam. She reports consuming a general diet. The patient does not participate in regular exercise at present. She generally feels well. She reports sleeping well. She does not have additional problems to discuss today.    Most recent fall risk assessment:     No data to display           Most recent depression screenings:     No data to display          Vision:Not within last year  and Dental: No current dental problems and No regular dental care   Past Medical History:  Diagnosis Date   Hidradenitis suppurativa    History of abnormal cervical Pap smear 2008   History of chlamydia    History of gonorrhea    Personal history of urinary (tract) infections     Past Surgical History:  Procedure Laterality Date   BREAST SURGERY Left 2012   underarm   BREAST SURGERY Right 2012   COLPOSCOPY  2008   PILONIDAL CYST EXCISION  2004; 2009    Family History  Problem Relation Age of Onset   Hypertension Mother    Hypertension Maternal Grandmother    Colon cancer Paternal Grandfather 61    Social History   Socioeconomic History   Marital status: Single    Spouse name: Not on file   Number of children: Not on file   Years of education: Not on file   Highest education level: Not on file  Occupational History   Not on file  Tobacco Use   Smoking status: Every Day    Current packs/day: 0.25    Types: Cigarettes   Smokeless tobacco: Never  Substance and Sexual Activity   Alcohol use: No   Drug use: No   Sexual activity: Not on file  Other Topics Concern   Not on file  Social History Narrative   Not on file   Social Determinants of Health   Financial Resource Strain: Not on file  Food Insecurity: Not on file   Transportation Needs: Not on file  Physical Activity: Not on file  Stress: Not on file  Social Connections: Not on file  Intimate Partner Violence: Not on file    Outpatient Medications Prior to Visit  Medication Sig   HUMIRA, 2 PEN, 80 MG/0.8ML pen Inject 80 mg as directed every 14 (fourteen) days.   spironolactone (ALDACTONE) 100 MG tablet Take 1 tablet by mouth daily.   [DISCONTINUED] clindamycin (CLINDAGEL) 1 % gel Apply topically 2 (two) times daily. To affected area (Patient not taking: Reported on 10/28/2023)   [DISCONTINUED] ibuprofen (ADVIL,MOTRIN) 600 MG tablet Take 1 tablet (600 mg total) by mouth every 6 (six) hours. (Patient not taking: Reported on 05/09/2017)   [DISCONTINUED] Prenatal Vit-Fe Fumarate-FA (PRENATAL MULTIVITAMIN) TABS tablet Take 1 tablet by mouth daily at 12 noon. (Patient not taking: Reported on 10/28/2023)   [DISCONTINUED] ranitidine (ZANTAC) 75 MG tablet Take 75 mg by mouth 2 (two) times daily. (Patient not taking: Reported on 10/28/2023)   No facility-administered medications prior to visit.    Review of Systems  Constitutional: Negative.   HENT: Negative.    Eyes: Negative.   Respiratory: Negative.  Negative  for shortness of breath.   Cardiovascular: Negative.  Negative for chest pain.  Gastrointestinal: Negative.  Negative for abdominal pain, constipation and diarrhea.  Genitourinary: Negative.   Musculoskeletal:  Negative for joint pain and myalgias.  Skin: Negative.   Neurological: Negative.  Negative for dizziness and headaches.  Endo/Heme/Allergies: Negative.   All other systems reviewed and are negative.       Objective:     BP 128/71   Pulse 78   Ht 5\' 5"  (1.651 m)   Wt 217 lb 12.8 oz (98.8 kg)   SpO2 97%   BMI 36.24 kg/m  BP Readings from Last 3 Encounters:  10/28/23 128/71  08/07/21 111/71  05/09/17 124/74      Physical Exam Vitals and nursing note reviewed.  Constitutional:      Appearance: Normal appearance. She is  normal weight.  HENT:     Head: Normocephalic and atraumatic.     Nose: Nose normal.     Mouth/Throat:     Mouth: Mucous membranes are moist.  Eyes:     Extraocular Movements: Extraocular movements intact.     Conjunctiva/sclera: Conjunctivae normal.     Pupils: Pupils are equal, round, and reactive to light.  Cardiovascular:     Rate and Rhythm: Normal rate and regular rhythm.     Pulses: Normal pulses.     Heart sounds: Normal heart sounds.  Pulmonary:     Effort: Pulmonary effort is normal.     Breath sounds: Normal breath sounds.  Abdominal:     General: Abdomen is flat. Bowel sounds are normal.     Palpations: Abdomen is soft.  Musculoskeletal:        General: Normal range of motion.     Cervical back: Normal range of motion.  Skin:    General: Skin is warm and dry.  Neurological:     General: No focal deficit present.     Mental Status: She is alert and oriented to person, place, and time.  Psychiatric:        Mood and Affect: Mood normal.        Behavior: Behavior normal.        Thought Content: Thought content normal.        Judgment: Judgment normal.      No results found for any visits on 10/28/23.  No results found for this or any previous visit (from the past 2160 hour(s)).      Assessment & Plan:    Routine Health Maintenance and Physical Exam   There is no immunization history on file for this patient.  Health Maintenance  Topic Date Due   Hepatitis C Screening  Never done   DTaP/Tdap/Td (1 - Tdap) Never done   Cervical Cancer Screening (HPV/Pap Cotest)  09/16/2015   INFLUENZA VACCINE  Never done   COVID-19 Vaccine (1 - 2023-24 season) Never done   HIV Screening  Completed   HPV VACCINES  Aged Out    Discussed health benefits of physical activity, and encouraged her to engage in regular exercise appropriate for her age and condition.  Problem List Items Addressed This Visit       Other   Lipid screening - Primary   Relevant Orders    CMP14+EGFR   Lipid Profile   Return if symptoms worsen or fail to improve.     Jamie Ivan, NP  10/28/2023   This document may have been prepared by [facility] Voice Recognition software and as such may include  unintentional dictation errors.

## 2023-10-29 LAB — CMP14+EGFR
ALT: 17 [IU]/L (ref 0–32)
AST: 14 [IU]/L (ref 0–40)
Albumin: 4.3 g/dL (ref 3.9–4.9)
Alkaline Phosphatase: 46 [IU]/L (ref 44–121)
BUN/Creatinine Ratio: 10 (ref 9–23)
BUN: 8 mg/dL (ref 6–20)
Bilirubin Total: 0.2 mg/dL (ref 0.0–1.2)
CO2: 23 mmol/L (ref 20–29)
Calcium: 9.5 mg/dL (ref 8.7–10.2)
Chloride: 102 mmol/L (ref 96–106)
Creatinine, Ser: 0.78 mg/dL (ref 0.57–1.00)
Globulin, Total: 3.3 g/dL (ref 1.5–4.5)
Glucose: 85 mg/dL (ref 70–99)
Potassium: 4.1 mmol/L (ref 3.5–5.2)
Sodium: 137 mmol/L (ref 134–144)
Total Protein: 7.6 g/dL (ref 6.0–8.5)
eGFR: 100 mL/min/{1.73_m2} (ref >59)

## 2023-10-29 LAB — LIPID PANEL
Chol/HDL Ratio: 4.1 ratio (ref 0.0–4.4)
Cholesterol, Total: 179 mg/dL (ref 100–199)
HDL: 44 mg/dL (ref >39)
LDL Chol Calc (NIH): 114 mg/dL — ABNORMAL HIGH (ref 0–99)
Triglycerides: 117 mg/dL (ref 0–149)
VLDL Cholesterol Cal: 21 mg/dL (ref 5–40)

## 2023-10-29 LAB — HEMOGLOBIN A1C
Est. average glucose Bld gHb Est-mCnc: 128 mg/dL
Hgb A1c MFr Bld: 6.1 % — ABNORMAL HIGH (ref 4.8–5.6)

## 2023-10-29 LAB — TSH: TSH: 1 u[IU]/mL (ref 0.450–4.500)

## 2023-11-14 NOTE — Unmapped (Signed)
[facility] Specialty and Home Delivery Pharmacy Refill Coordination Note    Specialty Medication(s) to be Shipped:   Inflammatory Disorders: Humira 80mg /0.59ml    Other medication(s) to be shipped: No additional medications requested for fill at this time     Kathryn [name], DOB: Nov 28, 1986  Phone: [phone] (home)       All above HIPAA information was verified with patient.     Was a Nurse, learning disability used for this call? No    Completed refill call assessment today to schedule patient's medication shipment from the [facility] and Home Delivery Pharmacy  [phone]).  All relevant notes have been reviewed.     Specialty medication(s) and dose(s) confirmed: Regimen is correct and unchanged.   Changes to medications: [name] reports no changes at this time.  Changes to insurance: No  New side effects reported not previously addressed with a pharmacist or physician: None reported  Questions for the pharmacist: No    Confirmed patient received a Conservation officer, historic buildings and a Surveyor, mining with first shipment. The patient will receive a drug information handout for each medication shipped and additional FDA Medication Guides as required.       DISEASE/MEDICATION-SPECIFIC INFORMATION        For patients on injectable medications: Patient currently has 0 doses left.  Next injection is scheduled for 11/18/23.    SPECIALTY MEDICATION ADHERENCE     Medication Adherence    Patient reported X missed doses in the last month: 0  Specialty Medication: adalimumab (HUMIRA,CF, PEN) 80 mg/0.8 mL PnKt  Patient is on additional specialty medications: No  Informant: patient              Were doses missed due to medication being on hold? No    Humira 80  mg/0.79ml : 0 days of medicine on hand       REFERRAL TO PHARMACIST     Referral to the pharmacist: Not needed      [facility]     Shipping address confirmed in Epic.       Delivery Scheduled: Yes, Expected medication delivery date: 11/18/23.     Medication will be delivered via UPS to the prescription address in Epic WAM.    Jasper Loser   [facility]ty and Home Delivery Pharmacy  Specialty Technician

## 2023-11-17 MED FILL — HUMIRA(CF) PEN 80 MG/0.8 ML SUBCUTANEOUS KIT: 28 days supply | Qty: 2 | Fill #5

## 2023-12-15 NOTE — Unmapped (Signed)
[facility] and Home Delivery Pharmacy Refill Coordination Note    Specialty Medication(s) to be Shipped:   Inflammatory Disorders: Humira 80mg /0.70ml    Other medication(s) to be shipped: No additional medications requested for fill at this time     Kathryn [name], DOB: 09-12-1986  Phone: [phone] (home)       All above HIPAA information was verified with patient.     Was a Nurse, learning disability used for this call? No    Completed refill call assessment today to schedule patient's medication shipment from the [facility] and Home Delivery Pharmacy  [phone]).  All relevant notes have been reviewed.     Specialty medication(s) and dose(s) confirmed: Regimen is correct and unchanged.   Changes to medications: Aldina reports no changes at this time.  Changes to insurance: No  New side effects reported not previously addressed with a pharmacist or physician: None reported  Questions for the pharmacist: No    Confirmed patient received a Conservation officer, historic buildings and a Surveyor, mining with first shipment. The patient will receive a drug information handout for each medication shipped and additional FDA Medication Guides as required.       DISEASE/MEDICATION-SPECIFIC INFORMATION        For patients on injectable medications: Patient currently has 1 doses left.  Next injection is scheduled for 12/11/23. Patient will take missed dose today    SPECIALTY MEDICATION ADHERENCE     Medication Adherence    Patient reported X missed doses in the last month: 1  Specialty Medication: adalimumab (HUMIRA,CF, PEN) 80 mg/0.8 mL PnKt  Patient is on additional specialty medications: No  Informant: patient   Other non-adherence reason: PAtinet had dose due 12/19. Patient had last delivery 11/26 with two pens. Will check refigerator when she gets home today, but Patient should have one dose left.                 Were doses missed due to medication being on hold? No    Humira 80  mg/0.7ml : 1 days of medicine on hand       REFERRAL TO PHARMACIST     Referral to the pharmacist: Not needed      [facility]     Shipping address confirmed in Epic.       Delivery Scheduled: Yes, Expected medication delivery date: 12/19/23.     Medication will be delivered via UPS to the prescription address in Epic WAM.    [name] Special[facility] and [facility]

## 2023-12-19 MED FILL — HUMIRA(CF) PEN 80 MG/0.8 ML SUBCUTANEOUS KIT: 28 days supply | Qty: 2 | Fill #6

## 2024-01-12 NOTE — Unmapped (Addendum)
Updating RC for CA due 1/21 AJ

## 2024-01-14 NOTE — Unmapped (Signed)
[facility] Specialty and Home Delivery Pharmacy Refill Coordination Note    Specialty Medication(s) to be Shipped:   Inflammatory Disorders: Humira 80mg /0.2ml    Other medication(s) to be shipped: No additional medications requested for fill at this time     Karie Chimera, DOB: 1986-07-05  Phone: [phone] (home)       All above HIPAA information was verified with patient.     Was a Nurse, learning disability used for this call? No    Completed refill call assessment today to schedule patient's medication shipment from the [facility] and Home Delivery Pharmacy  [phone]).  All relevant notes have been reviewed.     Specialty medication(s) and dose(s) confirmed: Regimen is correct and unchanged.   Changes to medications: Anarie reports no changes at this time.  Changes to insurance: No  New side effects reported not previously addressed with a pharmacist or physician: None reported  Questions for the pharmacist: No    Confirmed patient received a Conservation officer, historic buildings and a Surveyor, mining with first shipment. The patient will receive a drug information handout for each medication shipped and additional FDA Medication Guides as required.       DISEASE/MEDICATION-SPECIFIC INFORMATION        For patients on injectable medications: Patient currently has 0 doses left.  Next injection is scheduled for 01/15/24. Patient will take missed dose today    SPECIALTY MEDICATION ADHERENCE     Medication Adherence    Patient reported X missed doses in the last month: 0  Specialty Medication: HUMIRA(CF) PEN 80 mg/0.8 mL Pnkt (adalimumab)  Patient is on additional specialty medications: No              Were doses missed due to medication being on hold? No    Humira 80  mg/0.11ml : 0 doses of medicine on hand       REFERRAL TO PHARMACIST     Referral to the pharmacist: Not needed      [facility]     Shipping address confirmed in Epic.       Delivery Scheduled: Yes, Expected medication delivery date: 01/16/24.     Medication will be delivered via UPS to the prescription address in Epic WAM.    [name]  Specialty Technician

## 2024-01-15 MED FILL — HUMIRA(CF) PEN 80 MG/0.8 ML SUBCUTANEOUS KIT: 28 days supply | Qty: 2 | Fill #7

## 2024-02-05 NOTE — Unmapped (Signed)
[facility] Specialty and Home Delivery Pharmacy Refill Coordination Note    Specialty Medication(s) to be Shipped:   Inflammatory Disorders: Humira    Other medication(s) to be shipped: No additional medications requested for fill at this time     Kathryn [name], DOB: Mar 16, 1986  Phone: [phone] (home)       All above HIPAA information was verified with patient.     Was a Nurse, learning disability used for this call? No    Completed refill call assessment today to schedule patient's medication shipment from the [facility] and Home Delivery Pharmacy  [phone]).  All relevant notes have been reviewed.     Specialty medication(s) and dose(s) confirmed: Regimen is correct and unchanged.   Changes to medications: Kathryn [name] reports no changes at this time.  Changes to insurance: No  New side effects reported not previously addressed with a pharmacist or physician: None reported  Questions for the pharmacist: No    Confirmed patient received a Conservation officer, historic buildings and a Surveyor, mining with first shipment. The patient will receive a drug information handout for each medication shipped and additional FDA Medication Guides as required.       DISEASE/MEDICATION-SPECIFIC INFORMATION        For patients on injectable medications: Patient currently has 0 doses left.  Next injection is scheduled for 02/12/2024.    SPECIALTY MEDICATION ADHERENCE     Medication Adherence    Patient reported X missed doses in the last month: 0  Specialty Medication: adalimumab (HUMIRA,CF, PEN) 80 mg/0.8 mL PnKt  Patient is on additional specialty medications: No              Were doses missed due to medication being on hold? No    Humira 80 mg/ml: 0 days of medicine on hand       REFERRAL TO PHARMACIST     Referral to the pharmacist: Not needed      [facility]     Shipping address confirmed in Epic.       Delivery Scheduled: Yes, Expected medication delivery date: 02/10/2024.     Medication will be delivered via UPS to the prescription address in Epic WAM.    Kathryn [name]   [facility] Specialty and Home Delivery Pharmacy  Specialty Technician

## 2024-02-09 MED FILL — HUMIRA(CF) PEN 80 MG/0.8 ML SUBCUTANEOUS KIT: 28 days supply | Qty: 2 | Fill #8

## 2024-03-05 NOTE — Unmapped (Signed)
 [facility] Specialty and Home Delivery Pharmacy Refill Coordination Note    Specialty Medication(s) to be Shipped:   Inflammatory Disorders: Humira    Other medication(s) to be shipped: No additional medications requested for fill at this time     Kathryn [name], DOB: [birth year]  Phone: [phone] (home)       All above HIPAA information was verified with patient.     Was a Nurse, learning disability used for this call? No    Completed refill call assessment today to schedule patient's medication shipment from the [facility] and Home Delivery Pharmacy  [phone]).  All relevant notes have been reviewed.     Specialty medication(s) and dose(s) confirmed: Regimen is correct and unchanged.   Changes to medications: Kathryn [name] reports no changes at this time.  Changes to insurance: No  New side effects reported not previously addressed with a pharmacist or physician: None reported  Questions for the pharmacist: No    Confirmed patient received a Conservation officer, historic buildings and a Surveyor, mining with first shipment. The patient will receive a drug information handout for each medication shipped and additional FDA Medication Guides as required.       DISEASE/MEDICATION-SPECIFIC INFORMATION        For patients on injectable medications: Patient currently has 0 doses left.  Next injection is scheduled for 3/20.    SPECIALTY MEDICATION ADHERENCE     Medication Adherence    Patient reported X missed doses in the last month: 0  Specialty Medication: adalimumab (HUMIRA,CF, PEN) 80 mg/0.8 mL PnKt  Patient is on additional specialty medications: No  Informant: patient              Were doses missed due to medication being on hold? No    adalimumab (HUMIRA,CF, PEN) 80 mg/0.8 mL PnKt: 0 doses of medicine on hand     REFERRAL TO PHARMACIST     Referral to the pharmacist: Not needed      [facility]     Shipping address confirmed in Epic.     Cost and Payment: Patient has a copay of $4. They are aware and have authorized the pharmacy to charge the credit card on file.    Delivery Scheduled: Yes, Expected medication delivery date: 3/19.     Medication will be delivered via UPS to the prescription address in Epic WAM.    Kathryn [name] Specialty and [facility]

## 2024-03-09 MED FILL — HUMIRA(CF) PEN 80 MG/0.8 ML SUBCUTANEOUS KIT: 28 days supply | Qty: 2 | Fill #9

## 2024-04-02 DIAGNOSIS — L732 Hidradenitis suppurativa: Principal | ICD-10-CM

## 2024-04-02 MED ORDER — HUMIRA(CF) PEN 80 MG/0.8 ML SUBCUTANEOUS KIT
11 refills | 0.00 days
Start: 2024-04-02 — End: ?

## 2024-04-02 NOTE — Unmapped (Signed)
 Pt needs an appt last seen 03/13/23

## 2024-04-02 NOTE — Unmapped (Signed)
 Appointment on 04/22 with Lord Rod    Thanks  Jearlean Mince

## 2024-04-12 NOTE — Unmapped (Signed)
 Dermatology Note     Assessment and Plan:      Hidradenitis suppurativa, Hurley Stage II/III - chronic, flaring but improved compared to prior on humira   - We discussed the typical natural history, pathogenesis, treatment options, and expected course as well as the relapsing and sometimes recalcitrant nature of the disease.    - Reviewed chronic, waxing/waning nature of condition and associated comorbid conditions (pilonidal cysts, IBD, psoriasis, acne, dissecting cellulitis) and potential for sinus tract formation and scarring. Discussed that combination of medical and surgical therapy is often required for treatment.   - Reviewed role of antibiotics in HS to decrease inflammation   - Reviewed role of localized surgical procedures  - Reviewed role of biologic therapy   - Based on discussion above and r/b/a reviewed, mutual decision to proceed with the following    - Patient not interested in increasing humira  to weekly or transitioning to IL-17 at this time  - Continue humira  80 mg pnlj; inject 80 mg under the skin every 14 days  - Continue cefdinir  300 mg capsules; take 1 capsule by mouth twice daily   - Continue spironolactone  100 mg daily    Intralesional Kenalog Procedure Note: After the patient was informed of risks (including atrophy and dyspigmentation), benefits and side effects of intralesional steroid injection, the patient elected to undergo injection and verbal consent was obtained. Skin was cleaned with alcohol and injected intralesionally into the sites (below). The patient tolerated the procedure well without complications and was instructed on post-procedure care.  Location(s): 1  Number of sites treated: right axillae  Kenalog (triamcinolone) Concentration: 40 mg/ml   Volume: 0.3 ml total      High Risk Medication Use (Humira ):   - Quant gold negative 02/2023; re-ordered today  - hep panel WNL 02/2023    Subungual hemorrhage, left 1st proximal toenail  - Reassurance provided regarding the benign-appearance of lesions noted on exam today; no treatment is indicated in the absence of symptoms/changes.  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     The patient was advised to call for an appointment should any new, changing, or symptomatic lesions develop.     RTC: Return in about 3 months (around 07/13/2024). or sooner as needed   _________________________________________________________________      Chief Complaint     Chief Complaint   Patient presents with    Skin Problem    HS     Medications and spot on back of left leg and big toe  Injections under right arm and inner thighs.        HPI     Kathryn [name] is a 38 y.o. female who presents as a returning patient (last seen by me on 03/13/2023) to Dermatology for follow up of HS.     History of Present Illness  Kathryn [name] is a 38 year old female with hidradenitis suppurativa who presents for follow-up regarding her current treatment regimen.    Her condition has improved significantly with the use of Humira , administered every two weeks. Missing a dose leads to a quick flare-up of symptoms, raising concerns about potential worsening if the medication is discontinued.    She uses spironolactone  intermittently rather than daily and experiences  flares three to four days before her next Humira  injection is due. She is concerned about the frequency of her flares and the potential need for more frequent dosing.    She has not been taking cefdinir  or other antibiotics regularly, preferring to manage flares with Humira  injections. She has not pursued surgical consultation for pilonidal cysts, as she is currently managing well with her current treatment.    Her hidradenitis suppurativa lesions are calmer, with some internal nodules that are less inflamed. She reports a small flare under her breast that resolved on its own and mentions feeling inflammation under the skin in some areas.    She is concerned about a black spot on her toenail that appeared about four to five weeks ago, now peeling. No trauma to the area, but it developed after a pedicure. She is monitoring the spot for changes.    She also reports a skin tag on her leg that has been growing, which she attributes to friction from clothing. She is considering removal if it continues to grow.      The patient denies any other new or changing lesions or areas of concern.     Pertinent Past Medical History     No history of skin cancer    Family History:   Negative for melanoma    Past Medical History, Family History, Social History, Medication List, Allergies, and Problem List were reviewed in the rooming section of Epic.     ROS: Other than symptoms mentioned in the HPI, no fevers, chills, or other skin complaints    Physical Examination     GENERAL: Well-appearing female in no acute distress, resting comfortably.  NEURO: Alert and oriented, answers questions appropriately  PSYCH: Normal mood and affect  SKIN: Examination of the axillae, back, and groin was performed  - noninflamed nodules of bilateral axillae and thighs  - focal sinus tracts of bilateral axillae   - inflamed nodule of right axillae  - red-purple subunugal discoloration of left 1st proximal nail plate  - skin colored fleshy papule of right thigh    All areas not commented on are within normal limits or unremarkable  (Approved Template 09/04/2020)

## 2024-04-13 ENCOUNTER — Ambulatory Visit: Admit: 2024-04-13 | Discharge: 2024-04-13 | Payer: Medicaid (Managed Care)

## 2024-04-13 ENCOUNTER — Ambulatory Visit
Admit: 2024-04-13 | Discharge: 2024-04-13 | Payer: Medicaid (Managed Care) | Attending: Student in an Organized Health Care Education/Training Program | Primary: Student in an Organized Health Care Education/Training Program

## 2024-04-13 DIAGNOSIS — Z79899 Other long term (current) drug therapy: Principal | ICD-10-CM

## 2024-04-13 DIAGNOSIS — L918 Other hypertrophic disorders of the skin: Principal | ICD-10-CM

## 2024-04-13 DIAGNOSIS — L732 Hidradenitis suppurativa: Principal | ICD-10-CM

## 2024-04-13 DIAGNOSIS — L608 Other nail disorders: Principal | ICD-10-CM

## 2024-04-13 MED ORDER — HUMIRA(CF) PEN 80 MG/0.8 ML SUBCUTANEOUS KIT
SUBCUTANEOUS | 11 refills | 28.00 days | Status: CP
Start: 2024-04-13 — End: ?
  Filled 2024-04-21: qty 2, 28d supply, fill #0

## 2024-04-13 MED ORDER — CEFDINIR 300 MG CAPSULE
ORAL_CAPSULE | Freq: Two times a day (BID) | ORAL | 2 refills | 30.00 days | Status: CP
Start: 2024-04-13 — End: ?

## 2024-04-13 MED ORDER — HUMIRA PEN CITRATE FREE STARTER PACK FOR CROHN'S/UC/HS 3 X 80 MG/0.8 ML
0 refills | 0.00 days | Status: CN
Start: 2024-04-13 — End: ?

## 2024-04-13 MED ORDER — SPIRONOLACTONE 100 MG TABLET
ORAL_TABLET | 3 refills | 0.00 days | Status: CP
Start: 2024-04-13 — End: ?
  Filled 2024-04-21: qty 90, 90d supply, fill #0

## 2024-04-13 NOTE — Unmapped (Signed)
 Meet your team:     Your nurse is: Coralee North    Please remember to fill out the survey you will receive after your visit. Your comments help [place] continue to improve our care.      Thanks in advance!      [facility] Clinical Staff

## 2024-04-15 LAB — TB MITOGEN: TB MITOGEN VALUE: 10

## 2024-04-15 LAB — QUANTIFERON TB GOLD PLUS
QUANTIFERON ANTIGEN 1 MINUS NIL: -0.01 [IU]/mL
QUANTIFERON ANTIGEN 2 MINUS NIL: 0 [IU]/mL
QUANTIFERON MITOGEN: 9.93 [IU]/mL
QUANTIFERON TB GOLD PLUS: NEGATIVE
QUANTIFERON TB NIL VALUE: 0.07 [IU]/mL

## 2024-04-15 LAB — TB AG1: TB AG1 VALUE: 0.06

## 2024-04-15 LAB — TB NIL: TB NIL VALUE: 0.07

## 2024-04-15 LAB — TB AG2: TB AG2 VALUE: 0.07

## 2024-04-20 NOTE — Unmapped (Signed)
 Call from patient on nurse line. LVM stating her insurance is running out either today or tomorrow.  Needs assistance trying to pay for Humira .  States will call pharmacy to ask how much it costs.

## 2024-04-21 NOTE — Unmapped (Signed)
 [facility] Specialty and Home Delivery Pharmacy Refill Coordination Note    Specialty Medication(s) to be Shipped:   Inflammatory Disorders: Humira    Other medication(s) to be shipped:  spironolactone 100 MG tablet (ALDACTONE)     Kathryn [name], DOB: 1986/07/16  Phone: [phone] (home)       All above HIPAA information was verified with patient.     Was a Nurse, learning disability used for this call? No    Completed refill call assessment today to schedule patient's medication shipment from the [facility] and Home Delivery Pharmacy  [phone]).  All relevant notes have been reviewed.     Specialty medication(s) and dose(s) confirmed: Regimen is correct and unchanged.   Changes to medications: Jayme reports no changes at this time.  Changes to insurance: No  New side effects reported not previously addressed with a pharmacist or physician: None reported  Questions for the pharmacist: No    Confirmed patient received a Conservation officer, historic buildings and a Surveyor, mining with first shipment. The patient will receive a drug information handout for each medication shipped and additional FDA Medication Guides as required.       DISEASE/MEDICATION-SPECIFIC INFORMATION        For patients on injectable medications: Patient currently has 0 doses left.  Next injection is scheduled for 03/25/24.    SPECIALTY MEDICATION ADHERENCE     Medication Adherence    Patient reported X missed doses in the last month: 0  Specialty Medication: HUMIRA(CF) PEN 80 mg/0.8 mL Pnkt (adalimumab)  Patient is on additional specialty medications: No              Were doses missed due to medication being on hold? No    HUMIRA(CF) PEN 80 mg/0.8 mL Pnkt (adalimumab): 0 doses of medicine on hand       REFERRAL TO PHARMACIST     Referral to the pharmacist: Not needed      [facility]     Shipping address confirmed in Epic.     Cost and Payment: Patient has a copay of $8. They are aware and have authorized the pharmacy to charge the credit card on file.    Delivery Scheduled: Yes, Expected medication delivery date: 04/22/24.     Medication will be delivered via UPS to the prescription address in Epic WAM.    Viki Carrera   [facility] Specialty and Home Delivery Pharmacy  Specialty Technician

## 2024-04-22 DIAGNOSIS — L732 Hidradenitis suppurativa: Principal | ICD-10-CM

## 2024-05-14 NOTE — Unmapped (Signed)
 Patient applying for mfg assist for Humira since her insurance terminated.     Will move check in call/look for approval before disenrolling.    Chelsea Pedretti A. Felipe Horton, PharmD, BCPS - Clinical Pharmacist   [facility] and Home Delivery Pharm[facility]    [address] [place], [place]  [address]  t [phone], opt 4 then 2 - f [phone]

## 2024-05-25 DIAGNOSIS — L732 Hidradenitis suppurativa: Principal | ICD-10-CM

## 2024-06-15 NOTE — Unmapped (Signed)
 Specialty Medication(s): Humira     Ms.Zanetti has been dis-enrolled from the [facility] and Home Delivery Pharmacy specialty pharmacy services as a result of enrollment in a manufacturer assistance program that sends medicine directly to the patient.    Additional information provided to the patient: per [employer] referral, [employer] notified patient via email of enrollment and mfg will be reaching out to patient.     Joell Usman A [facility] Delivery Pharmacy Specialty Pharmacist

## 2024-07-05 ENCOUNTER — Ambulatory Visit (INDEPENDENT_AMBULATORY_CARE_PROVIDER_SITE_OTHER): Admitting: Cardiology

## 2024-07-05 ENCOUNTER — Encounter: Payer: Self-pay | Admitting: Cardiology

## 2024-07-05 VITALS — BP 125/78 | HR 77 | Ht 65.0 in | Wt 217.0 lb

## 2024-07-05 DIAGNOSIS — M545 Low back pain, unspecified: Secondary | ICD-10-CM | POA: Diagnosis not present

## 2024-07-05 DIAGNOSIS — Z1329 Encounter for screening for other suspected endocrine disorder: Secondary | ICD-10-CM | POA: Diagnosis not present

## 2024-07-05 DIAGNOSIS — G8929 Other chronic pain: Secondary | ICD-10-CM | POA: Diagnosis not present

## 2024-07-05 DIAGNOSIS — Z131 Encounter for screening for diabetes mellitus: Secondary | ICD-10-CM

## 2024-07-05 DIAGNOSIS — Z0130 Encounter for examination of blood pressure without abnormal findings: Secondary | ICD-10-CM

## 2024-07-05 DIAGNOSIS — Z13220 Encounter for screening for lipoid disorders: Secondary | ICD-10-CM | POA: Diagnosis not present

## 2024-07-05 MED ORDER — KETOROLAC TROMETHAMINE 10 MG PO TABS: 10.0000 mg | ORAL_TABLET | Freq: Four times a day (QID) | ORAL | 0 refills | Status: AC | PRN

## 2024-07-05 MED ORDER — BACLOFEN 10 MG PO TABS: 10.0000 mg | ORAL_TABLET | Freq: Every day | ORAL | 1 refills | Status: AC

## 2024-07-05 NOTE — Progress Notes (Addendum)
 Established Patient Office Visit  Subjective:  Patient ID: Jamie Dougherty, female    DOB: 02-Aug-1986  Age: 38 y.o. MRN: [id number]  Chief Complaint  Patient presents with   Acute Visit    Back pain.    Patient in office for ana cute visit, complaining of back pain. Patient reports she has had intermittent back pain for the past 2 years. Pain is left lower back, radiates across lower back to the right side. Has tried ibuprofen , Tylenol , ice, heat with no relief. Recommend getting an xray of the back today. Will send in Toradol  and Baclofen . Patient requesting an injection for pain relief. Back injections not done in this office. Discussed options. Patient will go to [facility].  Patient requesting medication for weight loss. Due for fasting lab work. Will discuss at visit in 2 weeks.   Back Pain This is a chronic problem. The current episode started yesterday. The problem occurs constantly. The problem is unchanged. The pain is present in the sacro-iliac and gluteal. The quality of the pain is described as stabbing. The pain radiates to the left thigh (across lower back). The pain is at a severity of 10/10. The pain is severe. The symptoms are aggravated by twisting, standing and position. Associated symptoms include weakness. Pertinent negatives include no abdominal pain, bladder incontinence, bowel incontinence, chest pain, headaches, numbness or tingling. Risk factors include obesity, lack of exercise, poor posture and sedentary lifestyle. She has tried NSAIDs, bed rest, analgesics, ice and heat for the symptoms. The treatment provided no relief.    No other concerns at this time.   Past Medical History:  Diagnosis Date   Hidradenitis suppurativa    History of abnormal cervical Pap smear 2008   History of chlamydia    History of gonorrhea    Personal history of urinary (tract) infections     Past Surgical History:  Procedure Laterality Date   BREAST SURGERY Left 2012   underarm    BREAST SURGERY Right 2012   COLPOSCOPY  2008   PILONIDAL CYST EXCISION  2004; 2009    Social History   Socioeconomic History   Marital status: Single    Spouse name: Not on file   Number of children: Not on file   Years of education: Not on file   Highest education level: Not on file  Occupational History   Not on file  Tobacco Use   Smoking status: Every Day    Current packs/day: 0.25    Types: Cigarettes   Smokeless tobacco: Never  Substance and Sexual Activity   Alcohol use: No   Drug use: No   Sexual activity: Not on file  Other Topics Concern   Not on file  Social History Narrative   Not on file   Social Drivers of Health   Financial Resource Strain: Not on file  Food Insecurity: Not on file  Transportation Needs: Not on file  Physical Activity: Not on file  Stress: Not on file  Social Connections: Not on file  Intimate Partner Violence: Not on file    Family History  Problem Relation Age of Onset   Hypertension Mother    Hypertension Maternal Grandmother    Colon cancer Paternal Grandfather 82    Allergies  Allergen Reactions   Sulfa Antibiotics Nausea And Vomiting and Other (See Comments)    Headache     Outpatient Medications Prior to Visit  Medication Sig   HUMIRA, 2 PEN, 80 MG/0.8ML pen Inject 80 mg  as directed every 14 (fourteen) days.   ibuprofen  (ADVIL ) 800 MG tablet Take 800 mg by mouth every 8 (eight) hours as needed.   spironolactone (ALDACTONE) 100 MG tablet Take 1 tablet by mouth daily.   No facility-administered medications prior to visit.    Review of Systems  Constitutional: Negative.   HENT: Negative.    Eyes: Negative.   Respiratory: Negative.  Negative for shortness of breath.   Cardiovascular: Negative.  Negative for chest pain.  Gastrointestinal: Negative.  Negative for abdominal pain, bowel incontinence, constipation and diarrhea.  Genitourinary: Negative.  Negative for bladder incontinence.  Musculoskeletal:   Positive for back pain. Negative for joint pain and myalgias.  Skin: Negative.   Neurological:  Positive for weakness. Negative for dizziness, tingling, numbness and headaches.  Endo/Heme/Allergies: Negative.   All other systems reviewed and are negative.      Objective:   BP 125/78   Pulse 77   Ht 5' 5 (1.651 m)   Wt 217 lb (98.4 kg)   SpO2 99%   BMI 36.11 kg/m   Vitals:   07/05/24 1337  BP: 125/78  Pulse: 77  Height: 5' 5 (1.651 m)  Weight: 217 lb (98.4 kg)  SpO2: 99%  BMI (Calculated): 36.11    Physical Exam Vitals and nursing note reviewed.  Constitutional:      Appearance: Normal appearance. She is normal weight.  HENT:     Head: Normocephalic and atraumatic.     Nose: Nose normal.     Mouth/Throat:     Mouth: Mucous membranes are moist.  Eyes:     Extraocular Movements: Extraocular movements intact.     Conjunctiva/sclera: Conjunctivae normal.     Pupils: Pupils are equal, round, and reactive to light.  Cardiovascular:     Rate and Rhythm: Normal rate and regular rhythm.     Pulses: Normal pulses.     Heart sounds: Normal heart sounds.  Pulmonary:     Effort: Pulmonary effort is normal.     Breath sounds: Normal breath sounds.  Abdominal:     General: Abdomen is flat. Bowel sounds are normal.     Palpations: Abdomen is soft.  Musculoskeletal:        General: Normal range of motion.     Cervical back: Normal range of motion.  Skin:    General: Skin is warm and dry.  Neurological:     General: No focal deficit present.     Mental Status: She is alert and oriented to person, place, and time.  Psychiatric:        Mood and Affect: Mood normal.        Behavior: Behavior normal.        Thought Content: Thought content normal.        Judgment: Judgment normal.      No results found for any visits on 07/05/24.  No results found for this or any previous visit (from the past 2160 hours).    Assessment & Plan:  [facility] for back  pain Baclofen  sent in Toradol  sent in Return for fasting lab work  Problem List Items Addressed This Visit       Other   Lipid screening   Relevant Orders   Lipid panel   Chronic left-sided low back pain - Primary   Relevant Medications   ibuprofen  (ADVIL ) 800 MG tablet   baclofen  (LIORESAL ) 10 MG tablet   ketorolac  (TORADOL ) 10 MG tablet   Other Relevant Orders  DG Lumbar Spine 2-3 Views   Other Visit Diagnoses       Thyroid  disorder screening       Relevant Orders   TSH     Diabetes mellitus screening       Relevant Orders   CMP14+EGFR   Hemoglobin A1c       Return in about 2 weeks (around 07/19/2024) for fasting lab work prior.   Total time spent: 25 minutes  [employer], NP  07/05/2024   This document may have been prepared by Dragon Voice Recognition software and as such may include unintentional dictation errors.

## 2024-07-09 ENCOUNTER — Other Ambulatory Visit

## 2024-07-09 DIAGNOSIS — Z1329 Encounter for screening for other suspected endocrine disorder: Secondary | ICD-10-CM

## 2024-07-09 DIAGNOSIS — Z13220 Encounter for screening for lipoid disorders: Secondary | ICD-10-CM

## 2024-07-09 DIAGNOSIS — Z131 Encounter for screening for diabetes mellitus: Secondary | ICD-10-CM

## 2024-07-10 LAB — CMP14+EGFR
ALT: 12 IU/L (ref 0–32)
AST: 18 IU/L (ref 0–40)
Albumin: 4 g/dL (ref 3.9–4.9)
Alkaline Phosphatase: 46 IU/L (ref 44–121)
BUN/Creatinine Ratio: 10 (ref 9–23)
BUN: 7 mg/dL (ref 6–20)
Bilirubin Total: <0.2 mg/dL (ref 0.0–1.2)
CO2: 20 mmol/L (ref 20–29)
Calcium: 9 mg/dL (ref 8.7–10.2)
Chloride: 106 mmol/L (ref 96–106)
Creatinine, Ser: 0.73 mg/dL (ref 0.57–1.00)
Globulin, Total: 3.2 g/dL (ref 1.5–4.5)
Glucose: 120 mg/dL — ABNORMAL HIGH (ref 70–99)
Potassium: 4.1 mmol/L (ref 3.5–5.2)
Sodium: 138 mmol/L (ref 134–144)
Total Protein: 7.2 g/dL (ref 6.0–8.5)
eGFR: 108 mL/min/1.73 (ref >59)

## 2024-07-10 LAB — LIPID PANEL
Chol/HDL Ratio: 4.2 ratio (ref 0.0–4.4)
Cholesterol, Total: 154 mg/dL (ref 100–199)
HDL: 37 mg/dL — ABNORMAL LOW (ref >39)
LDL Chol Calc (NIH): 95 mg/dL (ref 0–99)
Triglycerides: 119 mg/dL (ref 0–149)
VLDL Cholesterol Cal: 22 mg/dL (ref 5–40)

## 2024-07-10 LAB — TSH: TSH: 0.885 u[IU]/mL (ref 0.450–4.500)

## 2024-07-10 LAB — HEMOGLOBIN A1C
Est. average glucose Bld gHb Est-mCnc: 120 mg/dL
Hgb A1c MFr Bld: 5.8 % — ABNORMAL HIGH (ref 4.8–5.6)

## 2024-07-12 ENCOUNTER — Ambulatory Visit: Payer: Self-pay

## 2024-07-13 DIAGNOSIS — L732 Hidradenitis suppurativa: Principal | ICD-10-CM

## 2024-07-13 DIAGNOSIS — Z79899 Other long term (current) drug therapy: Principal | ICD-10-CM

## 2024-07-19 ENCOUNTER — Ambulatory Visit: Admitting: Cardiology

## 2024-08-27 DIAGNOSIS — L732 Hidradenitis suppurativa: Principal | ICD-10-CM

## 2024-08-27 DIAGNOSIS — L089 Local infection of the skin and subcutaneous tissue, unspecified: Principal | ICD-10-CM

## 2024-09-06 MED FILL — SPIRONOLACTONE 100 MG TABLET: ORAL | 90 days supply | Qty: 90 | Fill #1

## 2024-09-06 MED FILL — CEFDINIR 300 MG CAPSULE: ORAL | 30 days supply | Qty: 60 | Fill #0
